# Patient Record
Sex: Female | Born: 1950 | Race: White | Hispanic: No | Marital: Married | Smoking: Never smoker
Health system: Southern US, Community
[De-identification: ages and names within clinical notes are randomized; demographics above are authoritative.]

## PROBLEM LIST (undated history)

## (undated) DIAGNOSIS — Z973 Presence of spectacles and contact lenses: Secondary | ICD-10-CM

## (undated) DIAGNOSIS — J302 Other seasonal allergic rhinitis: Secondary | ICD-10-CM

## (undated) DIAGNOSIS — N200 Calculus of kidney: Secondary | ICD-10-CM

## (undated) DIAGNOSIS — R112 Nausea with vomiting, unspecified: Secondary | ICD-10-CM

## (undated) DIAGNOSIS — H9190 Unspecified hearing loss, unspecified ear: Secondary | ICD-10-CM

## (undated) DIAGNOSIS — I10 Essential (primary) hypertension: Secondary | ICD-10-CM

## (undated) DIAGNOSIS — Z9889 Other specified postprocedural states: Secondary | ICD-10-CM

## (undated) DIAGNOSIS — M549 Dorsalgia, unspecified: Secondary | ICD-10-CM

## (undated) DIAGNOSIS — M1990 Unspecified osteoarthritis, unspecified site: Secondary | ICD-10-CM

## (undated) DIAGNOSIS — E785 Hyperlipidemia, unspecified: Secondary | ICD-10-CM

## (undated) HISTORY — PX: CYSTOSCOPY/RETROGRADE/URETEROSCOPY/STONE EXTRACTION WITH BASKET: SHX5317

## (undated) HISTORY — PX: LITHOTRIPSY: SUR834

## (undated) HISTORY — PX: DILATION AND CURETTAGE OF UTERUS: SHX78

---

## 1995-12-19 HISTORY — PX: HAMMER TOE SURGERY: SHX385

## 1996-12-18 HISTORY — PX: CHOLECYSTECTOMY: SHX55

## 1997-12-18 HISTORY — PX: ABDOMINAL HYSTERECTOMY: SHX81

## 2005-01-17 ENCOUNTER — Ambulatory Visit: Payer: Self-pay | Admitting: Unknown Physician Specialty

## 2006-02-13 ENCOUNTER — Ambulatory Visit: Payer: Self-pay | Admitting: Unknown Physician Specialty

## 2007-03-12 ENCOUNTER — Ambulatory Visit: Payer: Self-pay | Admitting: Unknown Physician Specialty

## 2007-06-18 ENCOUNTER — Ambulatory Visit: Payer: Self-pay | Admitting: General Surgery

## 2008-06-26 ENCOUNTER — Ambulatory Visit: Payer: Self-pay | Admitting: Unknown Physician Specialty

## 2009-06-28 ENCOUNTER — Ambulatory Visit: Payer: Self-pay | Admitting: Internal Medicine

## 2011-02-28 ENCOUNTER — Ambulatory Visit: Payer: Self-pay | Admitting: Internal Medicine

## 2012-05-15 ENCOUNTER — Ambulatory Visit: Payer: Self-pay | Admitting: Internal Medicine

## 2012-05-18 HISTORY — PX: REDUCTION MAMMAPLASTY: SUR839

## 2013-05-13 ENCOUNTER — Encounter (HOSPITAL_BASED_OUTPATIENT_CLINIC_OR_DEPARTMENT_OTHER): Payer: Self-pay | Admitting: *Deleted

## 2013-05-13 NOTE — Progress Notes (Signed)
To come in for bmet-ekg-denies any sleep apnea or resp-cardiac problems To bring all meds and overnight bag-dr t told her she would stay overnight

## 2013-05-16 ENCOUNTER — Encounter (HOSPITAL_BASED_OUTPATIENT_CLINIC_OR_DEPARTMENT_OTHER)
Admission: RE | Admit: 2013-05-16 | Discharge: 2013-05-16 | Disposition: A | Discharge status: Home / Self Care | Payer: BC Managed Care – PPO | Source: Ambulatory Visit | Attending: Specialist | Admitting: Specialist

## 2013-05-16 LAB — BASIC METABOLIC PANEL
BUN: 12 mg/dL (ref 6–23)
Creatinine, Ser: 0.66 mg/dL (ref 0.50–1.10)
GFR calc non Af Amer: >90 mL/min (ref >90)
Glucose, Bld: 145 mg/dL — ABNORMAL HIGH (ref 70–99)
Potassium: 3.8 mEq/L (ref 3.5–5.1)

## 2013-05-19 ENCOUNTER — Encounter (HOSPITAL_BASED_OUTPATIENT_CLINIC_OR_DEPARTMENT_OTHER): Payer: Self-pay | Admitting: *Deleted

## 2013-05-19 ENCOUNTER — Encounter (HOSPITAL_BASED_OUTPATIENT_CLINIC_OR_DEPARTMENT_OTHER)
Admission: RE | Disposition: A | Discharge status: Home / Self Care | Payer: Self-pay | Source: Ambulatory Visit | Attending: Specialist

## 2013-05-19 ENCOUNTER — Encounter (HOSPITAL_BASED_OUTPATIENT_CLINIC_OR_DEPARTMENT_OTHER): Payer: Self-pay | Admitting: Anesthesiology

## 2013-05-19 ENCOUNTER — Ambulatory Visit (HOSPITAL_BASED_OUTPATIENT_CLINIC_OR_DEPARTMENT_OTHER): Payer: BC Managed Care – PPO | Admitting: Anesthesiology

## 2013-05-19 ENCOUNTER — Ambulatory Visit (HOSPITAL_BASED_OUTPATIENT_CLINIC_OR_DEPARTMENT_OTHER)
Admission: RE | Admit: 2013-05-19 | Discharge: 2013-05-20 | Disposition: A | Discharge status: Home / Self Care | Payer: BC Managed Care – PPO | Source: Ambulatory Visit | Attending: Specialist | Admitting: Specialist

## 2013-05-19 DIAGNOSIS — M25519 Pain in unspecified shoulder: Secondary | ICD-10-CM | POA: Insufficient documentation

## 2013-05-19 DIAGNOSIS — M129 Arthropathy, unspecified: Secondary | ICD-10-CM | POA: Insufficient documentation

## 2013-05-19 DIAGNOSIS — L538 Other specified erythematous conditions: Secondary | ICD-10-CM | POA: Insufficient documentation

## 2013-05-19 DIAGNOSIS — Z79899 Other long term (current) drug therapy: Secondary | ICD-10-CM | POA: Insufficient documentation

## 2013-05-19 DIAGNOSIS — Z6839 Body mass index (BMI) 39.0-39.9, adult: Secondary | ICD-10-CM | POA: Insufficient documentation

## 2013-05-19 DIAGNOSIS — L821 Other seborrheic keratosis: Secondary | ICD-10-CM | POA: Insufficient documentation

## 2013-05-19 DIAGNOSIS — N62 Hypertrophy of breast: Secondary | ICD-10-CM | POA: Insufficient documentation

## 2013-05-19 DIAGNOSIS — N6019 Diffuse cystic mastopathy of unspecified breast: Secondary | ICD-10-CM | POA: Insufficient documentation

## 2013-05-19 DIAGNOSIS — I10 Essential (primary) hypertension: Secondary | ICD-10-CM | POA: Insufficient documentation

## 2013-05-19 DIAGNOSIS — M549 Dorsalgia, unspecified: Secondary | ICD-10-CM | POA: Insufficient documentation

## 2013-05-19 DIAGNOSIS — H9190 Unspecified hearing loss, unspecified ear: Secondary | ICD-10-CM | POA: Insufficient documentation

## 2013-05-19 DIAGNOSIS — Z885 Allergy status to narcotic agent status: Secondary | ICD-10-CM | POA: Insufficient documentation

## 2013-05-19 DIAGNOSIS — E785 Hyperlipidemia, unspecified: Secondary | ICD-10-CM | POA: Insufficient documentation

## 2013-05-19 DIAGNOSIS — J309 Allergic rhinitis, unspecified: Secondary | ICD-10-CM | POA: Insufficient documentation

## 2013-05-19 HISTORY — DX: Other specified postprocedural states: Z98.890

## 2013-05-19 HISTORY — DX: Unspecified hearing loss, unspecified ear: H91.90

## 2013-05-19 HISTORY — DX: Presence of spectacles and contact lenses: Z97.3

## 2013-05-19 HISTORY — DX: Essential (primary) hypertension: I10

## 2013-05-19 HISTORY — DX: Nausea with vomiting, unspecified: R11.2

## 2013-05-19 HISTORY — DX: Hyperlipidemia, unspecified: E78.5

## 2013-05-19 HISTORY — PX: BREAST REDUCTION SURGERY: SHX8

## 2013-05-19 HISTORY — DX: Unspecified osteoarthritis, unspecified site: M19.90

## 2013-05-19 HISTORY — DX: Other seasonal allergic rhinitis: J30.2

## 2013-05-19 HISTORY — DX: Dorsalgia, unspecified: M54.9

## 2013-05-19 LAB — POCT HEMOGLOBIN-HEMACUE: Hemoglobin: 14.6 g/dL (ref 12.0–15.0)

## 2013-05-19 SURGERY — MAMMOPLASTY, REDUCTION
Anesthesia: General | Site: Breast | Laterality: Bilateral | Wound class: Clean

## 2013-05-19 MED ORDER — HYDROMORPHONE HCL PF 1 MG/ML IJ SOLN: 0.2500 mg | INTRAMUSCULAR | Status: DC | PRN

## 2013-05-19 MED ORDER — CEFAZOLIN SODIUM-DEXTROSE 2-3 GM-% IV SOLR
2.0000 g | INTRAVENOUS | Status: AC
  Administered 2013-05-19: 2 g via INTRAVENOUS

## 2013-05-19 MED ORDER — PROPOFOL 10 MG/ML IV BOLUS
INTRAVENOUS | Status: DC | PRN
  Administered 2013-05-19: 200 mg via INTRAVENOUS

## 2013-05-19 MED ORDER — MORPHINE SULFATE 2 MG/ML IJ SOLN: 2.0000 mg | INTRAMUSCULAR | Status: DC | PRN

## 2013-05-19 MED ORDER — OXYCODONE HCL 5 MG PO TABS: 5.0000 mg | ORAL_TABLET | Freq: Once | ORAL | Status: AC | PRN

## 2013-05-19 MED ORDER — DEXTROSE IN LACTATED RINGERS 5 % IV SOLN
INTRAVENOUS | Status: DC
  Administered 2013-05-19 (×2): via INTRAVENOUS

## 2013-05-19 MED ORDER — MIDAZOLAM HCL 5 MG/5ML IJ SOLN
INTRAMUSCULAR | Status: DC | PRN
  Administered 2013-05-19: 2 mg via INTRAVENOUS

## 2013-05-19 MED ORDER — LIDOCAINE HCL (CARDIAC) 20 MG/ML IV SOLN
INTRAVENOUS | Status: DC | PRN
  Administered 2013-05-19: 80 mg via INTRAVENOUS

## 2013-05-19 MED ORDER — METOCLOPRAMIDE HCL 5 MG/ML IJ SOLN
INTRAMUSCULAR | Status: DC | PRN
  Administered 2013-05-19: 10 mg via INTRAVENOUS

## 2013-05-19 MED ORDER — ONDANSETRON HCL 4 MG PO TABS: 4.0000 mg | ORAL_TABLET | Freq: Four times a day (QID) | ORAL | Status: DC | PRN

## 2013-05-19 MED ORDER — MORPHINE SULFATE 10 MG/ML IJ SOLN
INTRAMUSCULAR | Status: DC | PRN
  Administered 2013-05-19 (×5): 2 mg via INTRAVENOUS

## 2013-05-19 MED ORDER — MIDAZOLAM HCL 2 MG/2ML IJ SOLN: 1.0000 mg | INTRAMUSCULAR | Status: DC | PRN

## 2013-05-19 MED ORDER — SODIUM CHLORIDE 0.9 % IV SOLN
INTRAVENOUS | Status: DC | PRN
  Administered 2013-05-19: 11:00:00 via INTRAMUSCULAR

## 2013-05-19 MED ORDER — PHENYLEPHRINE HCL 10 MG/ML IJ SOLN
10.0000 mg | INTRAVENOUS | Status: DC | PRN
  Administered 2013-05-19: 50 ug/min via INTRAVENOUS

## 2013-05-19 MED ORDER — PHENYLEPHRINE HCL 10 MG/ML IJ SOLN
INTRAMUSCULAR | Status: DC | PRN
  Administered 2013-05-19: 40 ug via INTRAVENOUS

## 2013-05-19 MED ORDER — CEFAZOLIN SODIUM 1-5 GM-% IV SOLN
1.0000 g | Freq: Three times a day (TID) | INTRAVENOUS | Status: DC
  Administered 2013-05-19 – 2013-05-20 (×2): 1 g via INTRAVENOUS

## 2013-05-19 MED ORDER — LACTATED RINGERS IV SOLN
INTRAVENOUS | Status: DC
  Administered 2013-05-19 (×2): via INTRAVENOUS

## 2013-05-19 MED ORDER — LABETALOL HCL 5 MG/ML IV SOLN
INTRAVENOUS | Status: DC | PRN
  Administered 2013-05-19: 5 mg via INTRAVENOUS

## 2013-05-19 MED ORDER — LORATADINE 10 MG PO TABS: 10.0000 mg | ORAL_TABLET | ORAL | Status: DC | PRN

## 2013-05-19 MED ORDER — ONDANSETRON HCL 4 MG/2ML IJ SOLN
INTRAMUSCULAR | Status: DC | PRN
  Administered 2013-05-19: 4 mg via INTRAVENOUS

## 2013-05-19 MED ORDER — SUCCINYLCHOLINE CHLORIDE 20 MG/ML IJ SOLN
INTRAMUSCULAR | Status: DC | PRN
  Administered 2013-05-19: 100 mg via INTRAVENOUS

## 2013-05-19 MED ORDER — HYDROCODONE-ACETAMINOPHEN 5-325 MG PO TABS
1.0000 | ORAL_TABLET | ORAL | Status: DC | PRN
  Administered 2013-05-19 – 2013-05-20 (×2): 2 via ORAL

## 2013-05-19 MED ORDER — FENTANYL CITRATE 0.05 MG/ML IJ SOLN
INTRAMUSCULAR | Status: DC | PRN
  Administered 2013-05-19: 100 ug via INTRAVENOUS

## 2013-05-19 MED ORDER — FENTANYL CITRATE 0.05 MG/ML IJ SOLN: 50.0000 ug | INTRAMUSCULAR | Status: DC | PRN

## 2013-05-19 MED ORDER — OXYCODONE HCL 5 MG/5ML PO SOLN: 5.0000 mg | Freq: Once | ORAL | Status: AC | PRN

## 2013-05-19 MED ORDER — DEXAMETHASONE SODIUM PHOSPHATE 4 MG/ML IJ SOLN
INTRAMUSCULAR | Status: DC | PRN
  Administered 2013-05-19: 10 mg via INTRAVENOUS

## 2013-05-19 MED ORDER — SCOPOLAMINE 1 MG/3DAYS TD PT72
1.0000 | MEDICATED_PATCH | Freq: Once | TRANSDERMAL | Status: DC
  Administered 2013-05-19: 1.5 mg via TRANSDERMAL

## 2013-05-19 MED ORDER — ONDANSETRON HCL 4 MG/2ML IJ SOLN
4.0000 mg | Freq: Four times a day (QID) | INTRAMUSCULAR | Status: DC | PRN
  Administered 2013-05-19: 4 mg via INTRAVENOUS

## 2013-05-19 MED ORDER — SCOPOLAMINE 1 MG/3DAYS TD PT72
MEDICATED_PATCH | TRANSDERMAL | Status: DC | PRN
  Administered 2013-05-19: 1 via TRANSDERMAL

## 2013-05-19 MED ORDER — METOCLOPRAMIDE HCL 5 MG/ML IJ SOLN: 10.0000 mg | Freq: Once | INTRAMUSCULAR | Status: AC | PRN

## 2013-05-19 SURGICAL SUPPLY — 88 items
BAG DECANTER FOR FLEXI CONT (MISCELLANEOUS) ×2 IMPLANT
BANDAGE GAUZE ELAST BULKY 4 IN (GAUZE/BANDAGES/DRESSINGS) IMPLANT
BENZOIN TINCTURE PRP APPL 2/3 (GAUZE/BANDAGES/DRESSINGS) ×4 IMPLANT
BINDER BREAST 3XL (BIND) ×2 IMPLANT
BINDER BREAST XLRG (GAUZE/BANDAGES/DRESSINGS) IMPLANT
BINDER BREAST XXLRG (GAUZE/BANDAGES/DRESSINGS) ×4 IMPLANT
BLADE HEX COATED 2.75 (ELECTRODE) IMPLANT
BLADE KNIFE  20 PERSONNA (BLADE) ×2
BLADE KNIFE 20 PERSONNA (BLADE) ×2 IMPLANT
BLADE KNIFE PERSONA 10 (BLADE) ×10 IMPLANT
BLADE KNIFE PERSONA 15 (BLADE) ×2 IMPLANT
BNDG COHESIVE 4X5 TAN STRL (GAUZE/BANDAGES/DRESSINGS) IMPLANT
CANISTER LINER 1300 C W/ELBOW (MISCELLANEOUS) ×2 IMPLANT
CANISTER SUCTION 1200CC (MISCELLANEOUS) ×2 IMPLANT
CLEANER CAUTERY TIP 5X5 PAD (MISCELLANEOUS) IMPLANT
CLOTH BEACON ORANGE TIMEOUT ST (SAFETY) ×2 IMPLANT
COVER MAYO STAND STRL (DRAPES) ×2 IMPLANT
COVER TABLE BACK 60X90 (DRAPES) ×2 IMPLANT
DECANTER SPIKE VIAL GLASS SM (MISCELLANEOUS) ×4 IMPLANT
DRAIN CHANNEL 10F 3/8 F FF (DRAIN) ×4 IMPLANT
DRAPE LAPAROSCOPIC ABDOMINAL (DRAPES) ×2 IMPLANT
DRAPE PED LAPAROTOMY (DRAPES) IMPLANT
DRAPE U-SHAPE 76X120 STRL (DRAPES) IMPLANT
DRAPE UTILITY XL STRL (DRAPES) ×2 IMPLANT
DRSG PAD ABDOMINAL 8X10 ST (GAUZE/BANDAGES/DRESSINGS) ×8 IMPLANT
ELECT NEEDLE TIP 2.8 STRL (NEEDLE) IMPLANT
ELECT REM PT RETURN 9FT ADLT (ELECTROSURGICAL) ×2
ELECTRODE REM PT RTRN 9FT ADLT (ELECTROSURGICAL) ×1 IMPLANT
EVACUATOR SILICONE 100CC (DRAIN) ×4 IMPLANT
FILTER 7/8 IN (FILTER) ×2 IMPLANT
FILTER LIPOSUCTION (MISCELLANEOUS) ×2 IMPLANT
GAUZE SPONGE 4X4 12PLY STRL LF (GAUZE/BANDAGES/DRESSINGS) IMPLANT
GAUZE XEROFORM 1X8 LF (GAUZE/BANDAGES/DRESSINGS) IMPLANT
GAUZE XEROFORM 5X9 LF (GAUZE/BANDAGES/DRESSINGS) ×4 IMPLANT
GLOVE BIO SURGEON STRL SZ 6.5 (GLOVE) ×4 IMPLANT
GLOVE BIOGEL M STRL SZ7.5 (GLOVE) ×4 IMPLANT
GLOVE BIOGEL PI IND STRL 8 (GLOVE) ×2 IMPLANT
GLOVE BIOGEL PI INDICATOR 8 (GLOVE) ×2
GLOVE ECLIPSE 7.0 STRL STRAW (GLOVE) ×2 IMPLANT
GOWN PREVENTION PLUS XLARGE (GOWN DISPOSABLE) IMPLANT
GOWN PREVENTION PLUS XXLARGE (GOWN DISPOSABLE) ×4 IMPLANT
IV LACTATED RINGERS 1000ML (IV SOLUTION) IMPLANT
IV NS 500ML (IV SOLUTION) ×1
IV NS 500ML BAXH (IV SOLUTION) ×1 IMPLANT
NDL SAFETY ECLIPSE 18X1.5 (NEEDLE) IMPLANT
NEEDLE HYPO 18GX1.5 SHARP (NEEDLE)
NEEDLE HYPO 25X1 1.5 SAFETY (NEEDLE) IMPLANT
NEEDLE SPNL 18GX3.5 QUINCKE PK (NEEDLE) ×2 IMPLANT
NS IRRIG 1000ML POUR BTL (IV SOLUTION) ×4 IMPLANT
PACK BASIN DAY SURGERY FS (CUSTOM PROCEDURE TRAY) ×2 IMPLANT
PAD CLEANER CAUTERY TIP 5X5 (MISCELLANEOUS)
PEN SKIN MARKING BROAD TIP (MISCELLANEOUS) ×2 IMPLANT
PENCIL BUTTON HOLSTER BLD 10FT (ELECTRODE) IMPLANT
PILLOW FOAM RUBBER ADULT (PILLOWS) ×2 IMPLANT
PIN SAFETY STERILE (MISCELLANEOUS) ×2 IMPLANT
SHEET MEDIUM DRAPE 40X70 STRL (DRAPES) IMPLANT
SHEETING SILICONE GEL EPI DERM (MISCELLANEOUS) IMPLANT
SLEEVE SCD COMPRESS KNEE MED (MISCELLANEOUS) ×2 IMPLANT
SPECIMEN JAR MEDIUM (MISCELLANEOUS) IMPLANT
SPECIMEN JAR X LARGE (MISCELLANEOUS) ×4 IMPLANT
SPONGE GAUZE 4X4 12PLY (GAUZE/BANDAGES/DRESSINGS) ×4 IMPLANT
SPONGE LAP 18X18 X RAY DECT (DISPOSABLE) ×8 IMPLANT
STOCKINETTE 4X48 STRL (DRAPES) IMPLANT
STOCKINETTE IMPERVIOUS LG (DRAPES) IMPLANT
STRIP CLOSURE SKIN 1/8X3 (GAUZE/BANDAGES/DRESSINGS) IMPLANT
STRIP SUTURE WOUND CLOSURE 1/2 (SUTURE) ×10 IMPLANT
SUT ETHILON 3 0 PS 1 (SUTURE) IMPLANT
SUT MNCRL AB 3-0 PS2 18 (SUTURE) ×16 IMPLANT
SUT MON AB 2-0 CT1 36 (SUTURE) IMPLANT
SUT MON AB 5-0 PS2 18 (SUTURE) ×6 IMPLANT
SUT PROLENE 3 0 PS 2 (SUTURE) ×12 IMPLANT
SUT PROLENE 4 0 PS 2 18 (SUTURE) IMPLANT
SUT VIC AB 0 CT1 27 (SUTURE) ×1
SUT VIC AB 0 CT1 27XBRD ANBCTR (SUTURE) ×1 IMPLANT
SYR 20CC LL (SYRINGE) IMPLANT
SYR 50ML LL SCALE MARK (SYRINGE) ×4 IMPLANT
SYR CONTROL 10ML LL (SYRINGE) IMPLANT
TAPE HYPAFIX 6X30 (GAUZE/BANDAGES/DRESSINGS) ×2 IMPLANT
TAPE MEASURE 72IN RETRACT (INSTRUMENTS)
TAPE MEASURE LINEN 72IN RETRCT (INSTRUMENTS) IMPLANT
TAPE PAPER MEDFIX 1IN X 10YD (GAUZE/BANDAGES/DRESSINGS) ×2 IMPLANT
TOWEL OR 17X24 6PK STRL BLUE (TOWEL DISPOSABLE) ×6 IMPLANT
TOWEL OR NON WOVEN STRL DISP B (DISPOSABLE) ×2 IMPLANT
TUBE CONNECTING 20X1/4 (TUBING) ×2 IMPLANT
TUBING SET GRADUATE ASPIR 12FT (MISCELLANEOUS) ×2 IMPLANT
UNDERPAD 30X30 INCONTINENT (UNDERPADS AND DIAPERS) ×6 IMPLANT
VAC PENCILS W/TUBING CLEAR (MISCELLANEOUS) ×2 IMPLANT
YANKAUER SUCT BULB TIP NO VENT (SUCTIONS) ×2 IMPLANT

## 2013-05-19 NOTE — Anesthesia Procedure Notes (Signed)
Procedure Name: Intubation Performed by: York Grice Pre-anesthesia Checklist: Patient identified, Timeout performed, Emergency Drugs available, Suction available and Patient being monitored Patient Re-evaluated:Patient Re-evaluated prior to inductionOxygen Delivery Method: Circle system utilized Preoxygenation: Pre-oxygenation with 100% oxygen Intubation Type: IV induction Ventilation: Mask ventilation without difficulty Laryngoscope Size: Miller and 2 Grade View: Grade III Tube type: Oral Tube size: 7.0 mm Number of attempts: 1 Airway Equipment and Method: Stylet Placement Confirmation: breath sounds checked- equal and bilateral and positive ETCO2 Secured at: 21 cm Tube secured with: Tape Dental Injury: Teeth and Oropharynx as per pre-operative assessment

## 2013-05-19 NOTE — Transfer of Care (Signed)
Immediate Anesthesia Transfer of Care Note  Patient: Angel Harrison  Procedure(s) Performed: Procedure(s): BILATERAL BREAST REDUCTION   (Bilateral)  Patient Location: PACU  Anesthesia Type:General  Level of Consciousness: awake and sedated  Airway & Oxygen Therapy: Patient Spontanous Breathing and Patient connected to face mask oxygen  Post-op Assessment: Report given to PACU RN and Post -op Vital signs reviewed and stable  Post vital signs: Reviewed and stable  Complications: No apparent anesthesia complications

## 2013-05-19 NOTE — Brief Op Note (Signed)
05/19/2013  12:06 PM  PATIENT:  Randie Heinz  62 y.o. female  PRE-OPERATIVE DIAGNOSIS:  MACROMASTIA BILATERAL   POST-OPERATIVE DIAGNOSIS:  MACROMASTIA BILATERAL Severe  PROCEDURE:  Procedure(s): BILATERAL BREAST REDUCTION   (Bilateral)  SURGEON:  Surgeon(s) and Role:    * Louisa Second, MD - Primary  PHYSICIAN ASSISTANT:   ASSISTANTS: none   ANESTHESIA:   general  EBL:  Total I/O In: 2300 [I.V.:2300] Out: -   BLOOD ADMINISTERED:none  DRAINS: (right and left JP DRAINS) Jackson-Pratt drain(s) with closed bulb suction in the RIGHT AND LEFT CHEST AREAS   LOCAL MEDICATIONS USED:  LIDOCAINE   SPECIMEN:  Excision  DISPOSITION OF SPECIMEN:  PATHOLOGY  COUNTS:  YES  TOURNIQUET:  * No tourniquets in log *  DICTATION: .Other Dictation: Dictation Number [id number]  PLAN OF CARE: Admit for overnight observation  PATIENT DISPOSITION:  PACU - hemodynamically stable.   Delay start of Pharmacological VTE agent (>24hrs) due to surgical blood loss or risk of bleeding: yes

## 2013-05-19 NOTE — Anesthesia Postprocedure Evaluation (Signed)
Anesthesia Post Note  Patient: Angel Harrison  Procedure(s) Performed: Procedure(s) (LRB): BILATERAL BREAST REDUCTION   (Bilateral)  Anesthesia type: General  Patient location: PACU  Post pain: Pain level controlled  Post assessment: Patient's Cardiovascular Status Stable  Last Vitals:  Filed Vitals:   05/19/13 1445  BP: 145/72  Pulse: 88  Temp:   Resp: 17    Post vital signs: Reviewed and stable  Level of consciousness: alert  Complications: No apparent anesthesia complications

## 2013-05-19 NOTE — H&P (Signed)
Angel Harrison is an 62 y.o. female.   Chief Complaint: Severe macromastia HPI: Back and shoulder pain with intertrigo  Past Medical History  Diagnosis Date  . Wears glasses   . Hypertension   . Seasonal allergies   . Hyperlipidemia   . HOH (hard of hearing)   . Back pain   . PONV (postoperative nausea and vomiting)   . Arthritis     Past Surgical History  Procedure Laterality Date  . Abdominal hysterectomy  1999  . Dilation and curettage of uterus    . Cesarean section    . Hammer toe surgery  1997    right foot  . Cholecystectomy  1998    lap choli  . Lithotripsy    . Cystoscopy/retrograde/ureteroscopy/stone extraction with basket      History reviewed. No pertinent family history. Social History:  reports that she has never smoked. She does not have any smokeless tobacco history on file. She reports that she does not drink alcohol or use illicit drugs.  Allergies:  Allergies  Allergen Reactions  . Darvon (Propoxyphene Hcl)     Dizzy-crazy    Medications Prior to Admission  Medication Sig Dispense Refill  . hydrochlorothiazide (HYDRODIURIL) 25 MG tablet Take 25 mg by mouth daily.      Marland Kitchen lisinopril (PRINIVIL,ZESTRIL) 40 MG tablet Take 40 mg by mouth daily.      Marland Kitchen loratadine (CLARITIN) 10 MG tablet Take 10 mg by mouth as needed for allergies.      . simvastatin (ZOCOR) 40 MG tablet Take 40 mg by mouth at bedtime.      Marland Kitchen acetaminophen (TYLENOL) 325 MG tablet Take 650 mg by mouth every 6 (six) hours as needed for pain.        No results found for this or any previous visit (from the past 48 hour(s)). No results found.  Review of Systems  Constitutional: Negative.   HENT: Negative.   Eyes: Negative.   Respiratory: Negative.   Cardiovascular: Negative.   Gastrointestinal: Negative.   Genitourinary: Negative.   Musculoskeletal: Positive for back pain.  Skin: Positive for rash.  Neurological: Negative.   Endo/Heme/Allergies: Negative.    Psychiatric/Behavioral: Negative.     Blood pressure 185/84, pulse 69, temperature 97.8 F (36.6 C), temperature source Oral, resp. rate 20, height 4\' 11"  (1.499 m), weight 88.451 kg (195 lb), SpO2 100.00%. Physical Exam   Assessment/Plan Bilateral breast reductions for severe macromastia  Meziah Blasingame L 05/19/2013, 9:00 AM

## 2013-05-19 NOTE — Anesthesia Preprocedure Evaluation (Signed)
Anesthesia Evaluation  Patient identified by MRN, date of birth, ID band Patient awake    Reviewed: Allergy & Precautions, H&P , NPO status , Patient's Chart, lab work & pertinent test results, reviewed documented beta blocker date and time   History of Anesthesia Complications (+) PONV  Airway Mallampati: II TM Distance: >3 FB Neck ROM: full    Dental   Pulmonary neg pulmonary ROS,  breath sounds clear to auscultation        Cardiovascular hypertension, negative cardio ROS  Rhythm:regular     Neuro/Psych negative neurological ROS  negative psych ROS   GI/Hepatic negative GI ROS, Neg liver ROS,   Endo/Other  negative endocrine ROSMorbid obesity  Renal/GU negative Renal ROS  negative genitourinary   Musculoskeletal   Abdominal   Peds  Hematology negative hematology ROS (+)   Anesthesia Other Findings See surgeon's H&P   Reproductive/Obstetrics negative OB ROS                           Anesthesia Physical Anesthesia Plan  ASA: III  Anesthesia Plan: General   Post-op Pain Management:    Induction: Intravenous  Airway Management Planned: Oral ETT  Additional Equipment:   Intra-op Plan:   Post-operative Plan: Extubation in OR  Informed Consent: I have reviewed the patients History and Physical, chart, labs and discussed the procedure including the risks, benefits and alternatives for the proposed anesthesia with the patient or authorized representative who has indicated his/her understanding and acceptance.   Dental Advisory Given  Plan Discussed with: CRNA and Surgeon  Anesthesia Plan Comments:         Anesthesia Quick Evaluation

## 2013-05-20 ENCOUNTER — Encounter (HOSPITAL_BASED_OUTPATIENT_CLINIC_OR_DEPARTMENT_OTHER): Payer: Self-pay | Admitting: Specialist

## 2013-05-20 NOTE — Op Note (Signed)
NAMEEMILYANNE, MCGOUGH NO.:  [id number]  MEDICAL RECORD NO.:  [id number]  LOCATION:                                 FACILITY:  PHYSICIAN:  Earvin Hansen L. Marisa Hufstetler, M.D.DATE OF BIRTH:  08-Oct-1951  DATE OF PROCEDURE:  05/19/2013 DATE OF DISCHARGE:  05/19/2013                              OPERATIVE REPORT   A 62 year old lady with severe macromastia, back and shoulder pain secondary to large pendulous breasts, increased indentations, history of intertriginous changes, resistant to conservative treatment.  PROCEDURES DONE:  Bilateral breast reductions using the inferior pedicle technique.  SURGEON:  Yaakov Guthrie. Shon Hough, M.D.  ANESTHESIA:  General.  Preoperatively, the patient sat up and drawn for the inferior pedicle reduction mammoplasty, over 42 cm from the suprasternal notch to 26. She underwent general anesthesia and intubated orally without difficulty.  Prep was done to the chest and breast areas in routine fashion using Hibiclens soap and solution, walled off with sterile towels and drapes so as to make a sterile field.  A 0.25% Xylocaine 1:250,000 was injected locally for vasoconstriction, a total of 200 mL per side.  After waiting an appropriate amount of time, the wounds were scored with #15 blade and the skin of the inferior pedicle was de- epithelialized #20 blade.  Medial and lateral fatty dermal pedicles were incised down to underlying pectoralis major fascia, laterally increased and accessory breast tissue was removed sharply using a Bovie coagulation.  After this, the new keyhole area was defatted and the flap was also defatted and thinned appropriately.  Again hemostasis was maintained with Bovie coagulation.  After this the flaps were transposed and stayed with 3-0 Prolene.  Subcutaneous closure was done with 3-0 Monocryl x2 layers and a running subcuticular stitch of 3-0 Monocryl, 5- 0 Monocryl throughout the inverted T.  The wounds were drained  with #10, fully fluted Blake drains which were placed in the depths of wound and brought out through the lateral-most portion of the incisions bilaterally, removed over 2000 g on each side.  After proper hemostasis, the nipple-areolar complex were examined with good blood supply and turgor.  Sterile dressing were applied including 4x4s, ABDs, Hypafix tape, and breast garment.  ESTIMATED BLOOD LOSS:  Less than 200 mL.  COMPLICATIONS:  None.     Yaakov Guthrie. Shon Hough, M.D.    Cathie Hoops  D:  05/19/2013  T:  05/20/2013  Job:  [id number]

## 2017-11-14 ENCOUNTER — Other Ambulatory Visit: Payer: Self-pay

## 2017-11-14 ENCOUNTER — Encounter: Payer: Self-pay | Admitting: Emergency Medicine

## 2017-11-14 DIAGNOSIS — N179 Acute kidney failure, unspecified: Secondary | ICD-10-CM | POA: Diagnosis not present

## 2017-11-14 DIAGNOSIS — E785 Hyperlipidemia, unspecified: Secondary | ICD-10-CM | POA: Diagnosis present

## 2017-11-14 DIAGNOSIS — I10 Essential (primary) hypertension: Secondary | ICD-10-CM | POA: Diagnosis not present

## 2017-11-14 DIAGNOSIS — N132 Hydronephrosis with renal and ureteral calculous obstruction: Secondary | ICD-10-CM | POA: Diagnosis not present

## 2017-11-14 DIAGNOSIS — R1032 Left lower quadrant pain: Secondary | ICD-10-CM | POA: Diagnosis present

## 2017-11-14 DIAGNOSIS — Z9049 Acquired absence of other specified parts of digestive tract: Secondary | ICD-10-CM

## 2017-11-14 DIAGNOSIS — Z90710 Acquired absence of both cervix and uterus: Secondary | ICD-10-CM

## 2017-11-14 LAB — COMPREHENSIVE METABOLIC PANEL
ALK PHOS: 64 U/L (ref 38–126)
ALT: 17 U/L (ref 14–54)
AST: 24 U/L (ref 15–41)
Albumin: 3.9 g/dL (ref 3.5–5.0)
Anion gap: 13 (ref 5–15)
BILIRUBIN TOTAL: 0.8 mg/dL (ref 0.3–1.2)
BUN: 20 mg/dL (ref 6–20)
CALCIUM: 9.3 mg/dL (ref 8.9–10.3)
CO2: 26 mmol/L (ref 22–32)
CREATININE: 1.05 mg/dL — AB (ref 0.44–1.00)
Chloride: 99 mmol/L — ABNORMAL LOW (ref 101–111)
GFR calc non Af Amer: 54 mL/min — ABNORMAL LOW (ref >60)
Glucose, Bld: 142 mg/dL — ABNORMAL HIGH (ref 65–99)
Potassium: 3.7 mmol/L (ref 3.5–5.1)
SODIUM: 138 mmol/L (ref 135–145)
Total Protein: 7.7 g/dL (ref 6.5–8.1)

## 2017-11-14 LAB — URINALYSIS, COMPLETE (UACMP) WITH MICROSCOPIC
BACTERIA UA: NONE SEEN
Bilirubin Urine: NEGATIVE
Glucose, UA: NEGATIVE mg/dL
KETONES UR: NEGATIVE mg/dL
NITRITE: NEGATIVE
PROTEIN: 30 mg/dL — AB
Specific Gravity, Urine: 1.025 (ref 1.005–1.030)
pH: 5 (ref 5.0–8.0)

## 2017-11-14 LAB — CBC
HCT: 40.7 % (ref 35.0–47.0)
Hemoglobin: 13.8 g/dL (ref 12.0–16.0)
MCH: 28.4 pg (ref 26.0–34.0)
MCHC: 34 g/dL (ref 32.0–36.0)
MCV: 83.7 fL (ref 80.0–100.0)
Platelets: 201 10*3/uL (ref 150–440)
RBC: 4.87 MIL/uL (ref 3.80–5.20)
RDW: 13.6 % (ref 11.5–14.5)
WBC: 8.5 10*3/uL (ref 3.6–11.0)

## 2017-11-14 LAB — LIPASE, BLOOD: Lipase: 37 U/L (ref 11–51)

## 2017-11-14 NOTE — ED Triage Notes (Signed)
Patient ambulatory to triage with steady gait, without difficulty or distress noted; pt reports left lower abd pain, nonradiating, since 730pm with no accomp symptoms

## 2017-11-15 ENCOUNTER — Inpatient Hospital Stay: Payer: Medicare Other

## 2017-11-15 ENCOUNTER — Other Ambulatory Visit: Payer: Self-pay | Admitting: Radiology

## 2017-11-15 ENCOUNTER — Encounter
Admission: EM | Disposition: A | Discharge status: Home / Self Care | Payer: Self-pay | Source: Home / Self Care | Attending: Internal Medicine

## 2017-11-15 ENCOUNTER — Inpatient Hospital Stay
Admission: EM | Admit: 2017-11-15 | Discharge: 2017-11-16 | DRG: 692 | Disposition: A | Discharge status: Home / Self Care | Payer: Medicare Other | Attending: Internal Medicine | Admitting: Internal Medicine

## 2017-11-15 ENCOUNTER — Other Ambulatory Visit: Payer: Self-pay

## 2017-11-15 ENCOUNTER — Emergency Department: Payer: Medicare Other

## 2017-11-15 DIAGNOSIS — N139 Obstructive and reflux uropathy, unspecified: Secondary | ICD-10-CM | POA: Diagnosis not present

## 2017-11-15 DIAGNOSIS — R1032 Left lower quadrant pain: Secondary | ICD-10-CM | POA: Diagnosis present

## 2017-11-15 DIAGNOSIS — Z90710 Acquired absence of both cervix and uterus: Secondary | ICD-10-CM | POA: Diagnosis not present

## 2017-11-15 DIAGNOSIS — Z9049 Acquired absence of other specified parts of digestive tract: Secondary | ICD-10-CM | POA: Diagnosis not present

## 2017-11-15 DIAGNOSIS — I10 Essential (primary) hypertension: Secondary | ICD-10-CM | POA: Diagnosis not present

## 2017-11-15 DIAGNOSIS — N201 Calculus of ureter: Secondary | ICD-10-CM

## 2017-11-15 DIAGNOSIS — E785 Hyperlipidemia, unspecified: Secondary | ICD-10-CM | POA: Diagnosis not present

## 2017-11-15 DIAGNOSIS — N179 Acute kidney failure, unspecified: Secondary | ICD-10-CM | POA: Diagnosis not present

## 2017-11-15 DIAGNOSIS — N132 Hydronephrosis with renal and ureteral calculous obstruction: Secondary | ICD-10-CM | POA: Diagnosis not present

## 2017-11-15 HISTORY — PX: EXTRACORPOREAL SHOCK WAVE LITHOTRIPSY: SHX1557

## 2017-11-15 HISTORY — DX: Calculus of kidney: N20.0

## 2017-11-15 LAB — TSH: TSH: 5.172 u[IU]/mL — ABNORMAL HIGH (ref 0.350–4.500)

## 2017-11-15 SURGERY — LITHOTRIPSY, ESWL
Anesthesia: Moderate Sedation | Laterality: Left

## 2017-11-15 MED ORDER — MORPHINE SULFATE (PF) 2 MG/ML IV SOLN
2.0000 mg | INTRAVENOUS | Status: DC | PRN
  Administered 2017-11-15 (×2): 2 mg via INTRAVENOUS
  Filled 2017-11-15 (×2): qty 1

## 2017-11-15 MED ORDER — ACETAMINOPHEN 325 MG PO TABS: 650.0000 mg | ORAL_TABLET | Freq: Four times a day (QID) | ORAL | Status: DC | PRN

## 2017-11-15 MED ORDER — CIPROFLOXACIN HCL 500 MG PO TABS
500.0000 mg | ORAL_TABLET | Freq: Once | ORAL | Status: AC
  Administered 2017-11-15: 500 mg via ORAL

## 2017-11-15 MED ORDER — ONDANSETRON HCL 4 MG/2ML IJ SOLN
4.0000 mg | Freq: Once | INTRAMUSCULAR | Status: AC
  Administered 2017-11-15: 4 mg via INTRAVENOUS

## 2017-11-15 MED ORDER — MORPHINE SULFATE (PF) 2 MG/ML IV SOLN
INTRAVENOUS | Status: AC
  Administered 2017-11-15: 2 mg via INTRAVENOUS
  Filled 2017-11-15: qty 1

## 2017-11-15 MED ORDER — MORPHINE SULFATE (PF) 2 MG/ML IV SOLN
2.0000 mg | Freq: Once | INTRAVENOUS | Status: AC
  Administered 2017-11-15: 2 mg via INTRAVENOUS
  Filled 2017-11-15: qty 1

## 2017-11-15 MED ORDER — ONDANSETRON HCL 4 MG/2ML IJ SOLN
4.0000 mg | Freq: Once | INTRAMUSCULAR | Status: DC
  Filled 2017-11-15: qty 2

## 2017-11-15 MED ORDER — MORPHINE SULFATE (PF) 2 MG/ML IV SOLN
2.0000 mg | Freq: Once | INTRAVENOUS | Status: DC
  Filled 2017-11-15: qty 1

## 2017-11-15 MED ORDER — MORPHINE SULFATE (PF) 2 MG/ML IV SOLN
2.0000 mg | Freq: Once | INTRAVENOUS | Status: AC
  Administered 2017-11-15: 2 mg via INTRAVENOUS

## 2017-11-15 MED ORDER — CIPROFLOXACIN HCL 500 MG PO TABS
ORAL_TABLET | ORAL | Status: AC
  Filled 2017-11-15: qty 1

## 2017-11-15 MED ORDER — IOPAMIDOL (ISOVUE-300) INJECTION 61%
100.0000 mL | Freq: Once | INTRAVENOUS | Status: AC | PRN
  Administered 2017-11-15: 100 mL via INTRAVENOUS

## 2017-11-15 MED ORDER — ONDANSETRON HCL 4 MG/2ML IJ SOLN: 4.0000 mg | Freq: Four times a day (QID) | INTRAMUSCULAR | Status: DC | PRN

## 2017-11-15 MED ORDER — ACETAMINOPHEN 650 MG RE SUPP
650.0000 mg | Freq: Four times a day (QID) | RECTAL | Status: DC | PRN
Start: 2017-11-15 — End: 2017-11-15

## 2017-11-15 MED ORDER — BISOPROLOL-HYDROCHLOROTHIAZIDE 10-6.25 MG PO TABS
1.0000 | ORAL_TABLET | Freq: Every day | ORAL | Status: DC
  Administered 2017-11-16: 1 via ORAL
  Filled 2017-11-15 (×2): qty 1

## 2017-11-15 MED ORDER — LABETALOL HCL 5 MG/ML IV SOLN
5.0000 mg | INTRAVENOUS | Status: DC | PRN
  Administered 2017-11-15: 10 mg via INTRAVENOUS
  Filled 2017-11-15: qty 4

## 2017-11-15 MED ORDER — MORPHINE SULFATE (PF) 2 MG/ML IV SOLN: 2.0000 mg | INTRAVENOUS | Status: DC | PRN

## 2017-11-15 MED ORDER — TAMSULOSIN HCL 0.4 MG PO CAPS
0.4000 mg | ORAL_CAPSULE | Freq: Every day | ORAL | Status: DC
  Administered 2017-11-15 – 2017-11-16 (×2): 0.4 mg via ORAL
  Filled 2017-11-15 (×2): qty 1

## 2017-11-15 MED ORDER — DOCUSATE SODIUM 100 MG PO CAPS
100.0000 mg | ORAL_CAPSULE | Freq: Two times a day (BID) | ORAL | Status: DC
  Administered 2017-11-15: 100 mg via ORAL
  Filled 2017-11-15 (×2): qty 1

## 2017-11-15 MED ORDER — MORPHINE SULFATE (PF) 2 MG/ML IV SOLN
2.0000 mg | Freq: Once | INTRAVENOUS | Status: AC
Start: 2017-11-15 — End: 2017-11-15
  Administered 2017-11-15: 2 mg via INTRAVENOUS

## 2017-11-15 MED ORDER — OXYCODONE-ACETAMINOPHEN 5-325 MG PO TABS: 1.0000 | ORAL_TABLET | ORAL | Status: DC | PRN

## 2017-11-15 MED ORDER — MORPHINE SULFATE (PF) 2 MG/ML IV SOLN
INTRAVENOUS | Status: AC
  Filled 2017-11-15: qty 1

## 2017-11-15 MED ORDER — ONDANSETRON HCL 4 MG PO TABS: 4.0000 mg | ORAL_TABLET | Freq: Four times a day (QID) | ORAL | Status: DC | PRN

## 2017-11-15 MED ORDER — SODIUM CHLORIDE 0.9 % IV SOLN
INTRAVENOUS | Status: DC
  Administered 2017-11-15 (×2): via INTRAVENOUS

## 2017-11-15 MED ORDER — HEPARIN SODIUM (PORCINE) 5000 UNIT/ML IJ SOLN: 5000.0000 [IU] | Freq: Three times a day (TID) | INTRAMUSCULAR | Status: DC

## 2017-11-15 NOTE — Discharge Instructions (Signed)
AMBULATORY SURGERY  DISCHARGE INSTRUCTIONS   1) The drugs that you were given will stay in your system until tomorrow so for the next 24 hours you should not:  A) Drive an automobile B) Make any legal decisions C) Drink any alcoholic beverage   2) You may resume regular meals tomorrow.  Today it is better to start with liquids and gradually work up to solid foods.  You may eat anything you prefer, but it is better to start with liquids, then soup and crackers, and gradually work up to solid foods.   3) Please notify your doctor immediately if you have any unusual bleeding, trouble breathing, redness and pain at the surgery site, drainage, fever, or pain not relieved by medication.    Additional Instructions: Follow written discharge instructions  provided to you [facility])       Please contact your physician with any problems or Same Day Surgery at [phone], Monday through Friday 6 am to 4 pm, or [place] at [facility] Main number at [phone].

## 2017-11-15 NOTE — ED Notes (Signed)
Pt transported to room 216. 

## 2017-11-15 NOTE — ED Provider Notes (Signed)
[facility] Emergency Department Provider Note    First MD Initiated Contact with Patient 11/15/17 0132     (approximate)  I have reviewed the triage vital signs and the nursing notes.   HISTORY  Chief Complaint Abdominal Pain    HPI Angel NearingVicki R Harrison is a 66 y.o. female with below list of chronic medical conditions presents to the emergency department acute onset of left lower quadrant abdominal pain that is currently 10 out of 10.  Patient states that the pain began at 7:00 tonight.  Patient admits to nausea however no vomiting.  Patient denies any fever.  Patient diarrhea.  Patient denies any urinary   Past Medical History:  Diagnosis Date  . Arthritis   . Back pain   . HOH (hard of hearing)   . Hyperlipidemia   . Hypertension   . Kidney stone   . PONV (postoperative nausea and vomiting)   . Seasonal allergies   . Wears glasses     Patient Active Problem List   Diagnosis Date Noted  . Obstructive uropathy 11/15/2017    Past Surgical History:  Procedure Laterality Date  . ABDOMINAL HYSTERECTOMY  1999  . BREAST REDUCTION SURGERY Bilateral 05/19/2013   Procedure: BILATERAL BREAST REDUCTION  ;  Surgeon: Louisa SecondGerald Truesdale, MD;  Location: [facility];  Service: Plastics;  Laterality: Bilateral;  . CESAREAN SECTION    . CHOLECYSTECTOMY  1998   lap choli  . CYSTOSCOPY/RETROGRADE/URETEROSCOPY/STONE EXTRACTION WITH BASKET    . DILATION AND CURETTAGE OF UTERUS    . HAMMER TOE SURGERY  1997   right foot  . LITHOTRIPSY      Prior to Admission medications   Medication Sig Start Date End Date Taking? Authorizing Provider  bisoprolol-hydrochlorothiazide (ZIAC) 10-6.25 MG tablet Take 1 tablet by mouth daily. 09/28/17  Yes [provider]  loratadine (CLARITIN) 10 MG tablet Take 10 mg by mouth as needed for allergies.   Yes [provider]  simvastatin (ZOCOR) 40 MG tablet Take 1 tablet by mouth every evening. 10/09/17   Yes [provider]    Allergies Darvon [propoxyphene hcl]  No family history on file.  Social History Social History   Tobacco Use  . Smoking status: Never Smoker  Substance Use Topics  . Alcohol use: No  . Drug use: No    Review of Systems Constitutional: No fever/chills Eyes: No visual changes. ENT: No sore throat. Cardiovascular: Denies chest pain. Respiratory: Denies shortness of breath. Gastrointestinal: Positive for abdominal pain and nausea no vomiting.  No diarrhea.  No constipation. Genitourinary: Negative for dysuria. Musculoskeletal: Negative for neck pain.  Negative for back pain. Integumentary: Negative for rash. Neurological: Negative for headaches, focal weakness or numbness.   ____________________________________________   PHYSICAL EXAM:  VITAL SIGNS: ED Triage Vitals  Enc Vitals Group     BP 11/14/17 2327 (!) 228/85     Pulse Rate 11/14/17 2327 64     Resp 11/14/17 2327 20     Temp 11/14/17 2327 98.2 F (36.8 C)     Temp Source 11/14/17 2327 Oral     SpO2 11/14/17 2327 95 %     Weight 11/14/17 2325 81.6 kg (180 lb)     Height 11/14/17 2325 1.499 m (4\' 11" )     Head Circumference --      Peak Flow --      Pain Score 11/14/17 2324 8     Pain Loc --  Pain Edu? --      Excl. in GC? --     Constitutional: Alert and oriented. Well appearing and in no acute distress. Eyes: Conjunctivae are normal.  Head: Atraumatic. Mouth/Throat: Mucous membranes are moist. Oropharynx non-erythematous. Neck: No stridor. Cardiovascular: Normal rate, regular rhythm. Good peripheral circulation. Grossly normal heart sounds. Respiratory: Normal respiratory effort.  No retractions. Lungs CTAB. Gastrointestinal: Soft and nontender. No distention.  Musculoskeletal: No lower extremity tenderness nor edema. No gross deformities of extremities. Neurologic:  Normal speech and language. No gross focal neurologic deficits are appreciated.  Skin:  Skin is  warm, dry and intact. No rash noted. Psychiatric: Mood and affect are normal. Speech and behavior are normal.  ____________________________________________   LABS (all labs ordered are listed, but only abnormal results are displayed)  Labs Reviewed  COMPREHENSIVE METABOLIC PANEL - Abnormal; Notable for the following components:      Result Value   Chloride 99 (*)    Glucose, Bld 142 (*)    Creatinine, Ser 1.05 (*)    GFR calc non Af Amer 54 (*)    All other components within normal limits  URINALYSIS, COMPLETE (UACMP) WITH MICROSCOPIC - Abnormal; Notable for the following components:   Color, Urine YELLOW (*)    APPearance HAZY (*)    Hgb urine dipstick SMALL (*)    Protein, ur 30 (*)    Leukocytes, UA MODERATE (*)    Squamous Epithelial / LPF 0-5 (*)    All other components within normal limits  LIPASE, BLOOD  CBC     RADIOLOGY I, Easley N Cassity Christian, personally viewed and evaluated these images (plain radiographs) as part of my medical decision making, as well as reviewing the written report by the radiologist.  Ct Abdomen Pelvis W Contrast  Result Date: 11/15/2017 CLINICAL DATA:  Left lower quadrant pain EXAM: CT ABDOMEN AND PELVIS WITH CONTRAST TECHNIQUE: Multidetector CT imaging of the abdomen and pelvis was performed using the standard protocol following bolus administration of intravenous contrast. CONTRAST:  ISOVUE-300 IOPAMIDOL (ISOVUE-300) INJECTION 61% COMPARISON:  Report 06/29/2004 FINDINGS: Lower chest: No acute abnormality. Calcified fat density mass within the right breast, likely representing fat necrosis related to history of breast reduction. Hepatobiliary: Status post cholecystectomy. Mild intra and extrahepatic biliary dilatation. Pancreas: Unremarkable. No pancreatic ductal dilatation or surrounding inflammatory changes. Spleen: Normal in size without focal abnormality. Adrenals/Urinary Tract: Right adrenal gland is normal. 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 Punctate nonobstructing stones in the mid right kidney. Left perinephric fat stranding with severe hydronephrosis and hydroureter. This is secondary to a 9 mm stone within the mid to distal left ureter, at the S1 level. There are 2 smaller stones about a cm distal to the dominant stone, these measure up to 3 mm in size. The bladder is unremarkable There are multiple stones within the left kidney, these measure up cysts 7 mm in the lower pole. Delayed left nephrogram due to hydronephrosis. Stomach/Bowel: Stomach is within normal limits. Appendix appears normal. No evidence of bowel wall thickening, distention, or inflammatory changes. Vascular/Lymphatic: Moderate atherosclerosis. No aneurysmal dilatation. No significant adenopathy Reproductive: Status post hysterectomy. No adnexal masses. Other: Negative for free air or free fluid. Fat containing umbilical and periumbilical hernias. Musculoskeletal: Grade 1 anterolisthesis of L5 on S1. Degenerative changes. IMPRESSION: 1. Severe left hydronephrosis and hydroureter, secondary to a 9 mm stone within the mid to distal left ureter, anterior to approximate S1 level. There  are at least 2 additional smaller stones within the left ureter, about a cm distal to the large 9 mm stone. 2. Multiple intrarenal stones bilaterally Electronically Signed   By: Jasmine PangKim  Fujinaga M.D.   On: 11/15/2017 02:48      Procedures   ____________________________________________   INITIAL IMPRESSION / ASSESSMENT AND PLAN / ED COURSE  As part of my medical decision making, I reviewed the following data within the electronic MEDICAL RECORD [id number] year old female presented with above-stated history and physical exam concerning for possible kidney stone versus diverticulitis.  As such CT scan of the abdomen was performed which revealed multiple stones in the left ureter largest of which is 9 mm.  Patient has received 3 doses of IV morphine in the  emergency department with continued discomfort and as such patient discussed with Dr. Sheryle Haildiamond for hospital admission for further evaluation and management.  Patient also discussed with Dr. Vanna ScotlandAshley Brandon urologist on call.  ____________________________________________  FINAL CLINICAL IMPRESSION(S) / ED DIAGNOSES  Final diagnoses:  Obstructive uropathy  Ureterolithiasis     MEDICATIONS GIVEN DURING THIS VISIT:  Medications  morphine 2 MG/ML injection 2 mg (not administered)  morphine 2 MG/ML injection (not administered)  morphine 2 MG/ML injection 2 mg (2 mg Intravenous Given 11/15/17 0153)  ondansetron (ZOFRAN) injection 4 mg (4 mg Intravenous Given 11/15/17 0153)  iopamidol (ISOVUE-300) 61 % injection 100 mL (100 mLs Intravenous Contrast Given 11/15/17 0216)  morphine 2 MG/ML injection (2 mg Intravenous Given 11/15/17 0321)  morphine 2 MG/ML injection 2 mg (2 mg Intravenous Given 11/15/17 0240)     ED Discharge Orders    None       Note:  This document was prepared using Dragon voice recognition software and may include unintentional dictation errors.    Darci CurrentBrown, Cial[place] N, MD 11/15/17 [phone]

## 2017-11-15 NOTE — ED Notes (Signed)
Pt to the ER for llq pain that began today. Pt has a hx of kidney stones but it has been 20 years since having one. Pt denies pain with urination or dark urine.

## 2017-11-15 NOTE — OR Nursing (Signed)
Called report to Leslye Peerlivia Rogers RN,  Transported patient to room 216 via wheelchair.

## 2017-11-15 NOTE — H&P (Signed)
Angel Harrison is an 66 y.o. female.   Chief Complaint: Abdominal pain HPI: The patient with past medical history of hypertension and remote kidney stones presents to the emergency department complaining of abdominal pain.  It began approximately 9 hours prior to admission.  The patient states that would wax and wane in severity but became too great to bear.  In the emergency department CT of her abdomen revealed a 9 mm kidney stone on the left with associated hydronephrosis.  The patient received multiple doses of IV pain medication without relief of pain.  Urology was notified and the emergency department staff contacted the hospitalist service for admission.  Past Medical History:  Diagnosis Date  . Arthritis   . Back pain   . HOH (hard of hearing)   . Hyperlipidemia   . Hypertension   . Kidney stone   . PONV (postoperative nausea and vomiting)   . Seasonal allergies   . Wears glasses     Past Surgical History:  Procedure Laterality Date  . ABDOMINAL HYSTERECTOMY  1999  . BREAST REDUCTION SURGERY Bilateral 05/19/2013   Procedure: BILATERAL BREAST REDUCTION  ;  Surgeon: Cristine Polio, MD;  Location: [facility];  Service: Plastics;  Laterality: Bilateral;  . CESAREAN SECTION    . CHOLECYSTECTOMY  1998   lap choli  . CYSTOSCOPY/RETROGRADE/URETEROSCOPY/STONE EXTRACTION WITH BASKET    . DILATION AND CURETTAGE OF UTERUS    . [place]   right foot  . LITHOTRIPSY      Family History  Problem Relation Age of Onset  . CAD Father    Social History:  reports that  has never smoked. She does not have any smokeless tobacco history on file. She reports that she does not drink alcohol or use drugs.  Allergies:  Allergies  Allergen Reactions  . Darvon [Propoxyphene Hcl]     Dizzy-crazy    Medications Prior to Admission  Medication Sig Dispense Refill  . bisoprolol-hydrochlorothiazide (ZIAC) 10-6.25 MG tablet Take 1 tablet by mouth daily.    Marland Kitchen  loratadine (CLARITIN) 10 MG tablet Take 10 mg by mouth as needed for allergies.    . simvastatin (ZOCOR) 40 MG tablet Take 1 tablet by mouth every evening.      Results for orders placed or performed during the hospital encounter of 11/15/17 (from the past 48 hour(s))  Lipase, blood     Status: None   Collection Time: 11/14/17 11:22 PM  Result Value Ref Range   Lipase 37 11 - 51 U/L  Comprehensive metabolic panel     Status: Abnormal   Collection Time: 11/14/17 11:22 PM  Result Value Ref Range   Sodium 138 135 - 145 mmol/L   Potassium 3.7 3.5 - 5.1 mmol/L   Chloride 99 (L) 101 - 111 mmol/L   CO2 26 22 - 32 mmol/L   Glucose, Bld 142 (H) 65 - 99 mg/dL   BUN 20 6 - 20 mg/dL   Creatinine, Ser 1.05 (H) 0.44 - 1.00 mg/dL   Calcium 9.3 8.9 - 10.3 mg/dL   Total Protein 7.7 6.5 - 8.1 g/dL   Albumin 3.9 3.5 - 5.0 g/dL   AST 24 15 - 41 U/L   ALT 17 14 - 54 U/L   Alkaline Phosphatase 64 38 - 126 U/L   Total Bilirubin 0.8 0.3 - 1.2 mg/dL   GFR calc non Af Amer 54 (L) >60 mL/min   GFR calc Af Amer >60 >60 mL/min  Comment: (NOTE) The eGFR has been calculated using the CKD EPI equation. This calculation has not been validated in all clinical situations. eGFR's persistently <60 mL/min signify possible Chronic Kidney Disease.    Anion gap 13 5 - 15  CBC     Status: None   Collection Time: 11/14/17 11:22 PM  Result Value Ref Range   WBC 8.5 3.6 - 11.0 K/uL   RBC 4.87 3.80 - 5.20 MIL/uL   Hemoglobin 13.8 12.0 - 16.0 g/dL   HCT 40.7 35.0 - 47.0 %   MCV 83.7 80.0 - 100.0 fL   MCH 28.4 26.0 - 34.0 pg   MCHC 34.0 32.0 - 36.0 g/dL   RDW 13.6 11.5 - 14.5 %   Platelets 201 150 - 440 K/uL  Urinalysis, Complete w Microscopic     Status: Abnormal   Collection Time: 11/14/17 11:27 PM  Result Value Ref Range   Color, Urine YELLOW (A) YELLOW   APPearance HAZY (A) CLEAR   Specific Gravity, Urine 1.025 1.005 - 1.030   pH 5.0 5.0 - 8.0   Glucose, UA NEGATIVE NEGATIVE mg/dL   Hgb urine dipstick  SMALL (A) NEGATIVE   Bilirubin Urine NEGATIVE NEGATIVE   Ketones, ur NEGATIVE NEGATIVE mg/dL   Protein, ur 30 (A) NEGATIVE mg/dL   Nitrite NEGATIVE NEGATIVE   Leukocytes, UA MODERATE (A) NEGATIVE   RBC / HPF 6-30 0 - 5 RBC/hpf   WBC, UA 6-30 0 - 5 WBC/hpf   Bacteria, UA NONE SEEN NONE SEEN   Squamous Epithelial / LPF 0-5 (A) NONE SEEN   Mucus PRESENT    Ca Oxalate Crys, UA PRESENT    Ct Abdomen Pelvis W Contrast  Result Date: 11/15/2017 CLINICAL DATA:  Left lower quadrant pain EXAM: CT ABDOMEN AND PELVIS WITH CONTRAST TECHNIQUE: Multidetector CT imaging of the abdomen and pelvis was performed using the standard protocol following bolus administration of intravenous contrast. CONTRAST:  139m ISOVUE-300 IOPAMIDOL (ISOVUE-300) INJECTION 61% COMPARISON:  Report 06/29/2004 FINDINGS: Lower chest: No acute abnormality. Calcified fat density mass within the right breast, likely representing fat necrosis related to history of breast reduction. Hepatobiliary: Status post cholecystectomy. Mild intra and extrahepatic biliary dilatation. Pancreas: Unremarkable. No pancreatic ductal dilatation or surrounding inflammatory changes. Spleen: Normal in size without focal abnormality. Adrenals/Urinary Tract: Right adrenal gland is normal. 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 Punctate nonobstructing stones in the mid right kidney. Left perinephric fat stranding with severe hydronephrosis and hydroureter. This is secondary to a 9 mm stone within the mid to distal left ureter, at the S1 level. There are 2 smaller stones about a cm distal to the dominant stone, these measure up to 3 mm in size. The bladder is unremarkable There are multiple stones within the left kidney, these measure up cysts 7 mm in the lower pole. Delayed left nephrogram due to hydronephrosis. Stomach/Bowel: Stomach is within normal limits. Appendix appears normal. No evidence of bowel wall thickening,  distention, or inflammatory changes. Vascular/Lymphatic: Moderate atherosclerosis. No aneurysmal dilatation. No significant adenopathy Reproductive: Status post hysterectomy. No adnexal masses. Other: Negative for free air or free fluid. Fat containing umbilical and periumbilical hernias. Musculoskeletal: Grade 1 anterolisthesis of L5 on S1. Degenerative changes. IMPRESSION: 1. Severe left hydronephrosis and hydroureter, secondary to a 9 mm stone within the mid to distal left ureter, anterior to approximate S1 level. There are at least 2 additional smaller stones within the left ureter, about a cm distal to  the large 9 mm stone. 2. Multiple intrarenal stones bilaterally Electronically Signed   By: Donavan Foil M.D.   On: 11/15/2017 02:48    Review of Systems  Constitutional: Negative for chills and fever.  HENT: Negative for sore throat and tinnitus.   Eyes: Negative for blurred vision and redness.  Respiratory: Negative for cough and shortness of breath.   Cardiovascular: Negative for chest pain, palpitations, orthopnea and PND.  Gastrointestinal: Positive for abdominal pain. Negative for diarrhea, nausea and vomiting.  Genitourinary: Negative for dysuria, frequency and urgency.  Musculoskeletal: Negative for joint pain and myalgias.  Skin: Negative for rash.       No lesions  Neurological: Negative for speech change, focal weakness and weakness.  Endo/Heme/Allergies: Does not bruise/bleed easily.       No temperature intolerance  Psychiatric/Behavioral: Negative for depression and suicidal ideas.    Blood pressure (!) 179/88, pulse 64, temperature 98.2 F (36.8 C), temperature source Oral, resp. rate 20, height _0  (1.499 m), weight 81.6 kg (180 lb), SpO2 97 %. Physical Exam  Vitals reviewed. Constitutional: She is oriented to person, place, and time. She appears well-developed and well-nourished. No distress.  HENT:  Head: Normocephalic and atraumatic.  Mouth/Throat: Oropharynx is  clear and moist.  Eyes: Conjunctivae and EOM are normal. Pupils are equal, round, and reactive to light. No scleral icterus.  Neck: Normal range of motion. Neck supple. No JVD present. No tracheal deviation present. No thyromegaly present.  Cardiovascular: Normal rate, regular rhythm and normal heart sounds. Exam reveals no gallop and no friction rub.  No murmur heard. Respiratory: Effort normal and breath sounds normal.  GI: Soft. Bowel sounds are normal. She exhibits no distension. There is no tenderness.  Genitourinary:  Genitourinary Comments: Deferred  Musculoskeletal: Normal range of motion. She exhibits no edema.  Lymphadenopathy:    She has no cervical adenopathy.  Neurological: She is alert and oriented to person, place, and time. No cranial nerve deficit. She exhibits normal muscle tone.  Skin: Skin is warm and dry. No rash noted. No erythema.  Psychiatric: She has a normal mood and affect. Her behavior is normal. Judgment and thought content normal.     Assessment/Plan This is a 66 year old female admitted for obstructive uropathy. 1.  Obstructive uropathy: Secondary to large renal calculus within left ureter.  Associated hydronephrosis.  Hydrate aggressively with intravenous fluid.  IV narcotics for analgesia.  The patient is n.p.o. for anticipated lithotripsy. 2.  Acute kidney injury: Secondary to hydronephrosis.  Avoid nephrotoxic agents.  Monitor urine output. 3.  Hypertension: Uncontrolled; continue bisoprolol with hydrochlorothiazide.  Labetalol as needed 4.  DVT prophylaxis: SCDs 5.  GI prophylaxis: None The patient is a full code.  Time spent on admission orders and patient care approximately 45 minutes  Harrie Foreman, MD 11/15/2017, 4:58 AM

## 2017-11-15 NOTE — Consult Note (Signed)
Urology Consult  I have been asked to see the patient by Dr. Manson PasseyBrown, for evaluation and management of left ureteral stone.  Chief Complaint: left flank pain  History of Present Illness: Angel Harrison is a 66 y.o. year old with a remote history of nephrolithiasis who presented to the emergency room early this morning with poorly controlled left lower quadrant pain/left flank pain.  The pain is severe and unable to be controlled well with IV narcotics.  Due to this, she was admitted to the medical service for pain control.  She reports that she developed severe acute left lower quadrant pain which started yesterday evening and has been unremitting since this.  She does have associated nausea but no vomiting.  No fevers or chills.  No dysuria, gross hematuria, or any other voiding symptoms.  Noncontrast CT scan shows a 9 and 3 mm  left ureteral stone near the level of the iliac vessels with severe hydroureteronephrosis proximally.   Labs and UA otherwise fairly unremarkable.  She does have a remote history of kidney stones about 20 years ago.  She had shockwave lithotripsy as well as ureteroscopy in the remote past. \  Past Medical History:  Diagnosis Date  . Arthritis   . Back pain   . HOH (hard of hearing)   . Hyperlipidemia   . Hypertension   . Kidney stone   . PONV (postoperative nausea and vomiting)   . Seasonal allergies   . Wears glasses     Past Surgical History:  Procedure Laterality Date  . ABDOMINAL HYSTERECTOMY  1999  . BREAST REDUCTION SURGERY Bilateral 05/19/2013   Procedure: BILATERAL BREAST REDUCTION  ;  Surgeon: Louisa SecondGerald Truesdale, MD;  Location: [facility];  Service: Plastics;  Laterality: Bilateral;  . CESAREAN SECTION    . CHOLECYSTECTOMY  1998   lap choli  . CYSTOSCOPY/RETROGRADE/URETEROSCOPY/STONE EXTRACTION WITH BASKET    . DILATION AND CURETTAGE OF UTERUS    . HAMMER TOE SURGERY  1997   right foot  . LITHOTRIPSY      Home  Medications:  Current Meds  Medication Sig  . bisoprolol-hydrochlorothiazide (ZIAC) 10-6.25 MG tablet Take 1 tablet by mouth daily.  Marland Kitchen. loratadine (CLARITIN) 10 MG tablet Take 10 mg by mouth as needed for allergies.  . simvastatin (ZOCOR) 40 MG tablet Take 1 tablet by mouth every evening.    Allergies:  Allergies  Allergen Reactions  . Darvon [Propoxyphene Hcl]     Dizzy-crazy    Family History  Problem Relation Age of Onset  . CAD Father     Social History:  reports that  has never smoked. she has never used smokeless tobacco. She reports that she does not drink alcohol or use drugs.  ROS: A complete review of systems was performed.  All systems are negative except for pertinent findings as noted.  Physical Exam:  Vital signs in last 24 hours: Temp:  [97.9 F (36.6 C)-98.2 F (36.8 C)] 97.9 F (36.6 C) (11/29 0506) Pulse Rate:  [62-76] 62 (11/29 0554) Resp:  [20] 20 (11/29 0506) BP: (119-228)/(66-88) 147/69 (11/29 0554) SpO2:  [94 %-97 %] 97 % (11/29 0506) Weight:  [170 lb 9.6 oz (77.4 kg)-180 lb (81.6 kg)] 170 lb 9.6 oz (77.4 kg) (11/29 0506) Constitutional:  Alert and oriented, No acute distress HEENT: [place] AT, moist mucus membranes.  Trachea midline, no masses Cardiovascular: Regular rate and rhythm, no clubbing, cyanosis, or edema. Respiratory: Normal respiratory effort, lungs clear bilaterally  GI: Abdomen is soft, tenderness n the left lower quadrant with voluntary guarding. GU: No CVA tenderness Skin: No rashes, bruises or suspicious lesions Neurologic: Grossly intact, no focal deficits, moving all 4 extremities Psychiatric: Normal mood and affect   Laboratory Data:  Recent Labs    11/14/17 2322  WBC 8.5  HGB 13.8  HCT 40.7   Recent Labs    11/14/17 2322  NA 138  K 3.7  CL 99*  CO2 26  GLUCOSE 142*  BUN 20  CREATININE 1.05*  CALCIUM 9.3   Component     Latest Ref Rng & Units 11/14/2017  Color, Urine     YELLOW YELLOW (A)  Appearance      CLEAR HAZY (A)  Specific Gravity, Urine     1.005 - 1.030 1.025  pH     5.0 - 8.0 5.0  Glucose     NEGATIVE mg/dL NEGATIVE  Hgb urine dipstick     NEGATIVE SMALL (A)  Bilirubin Urine     NEGATIVE NEGATIVE  Ketones, ur     NEGATIVE mg/dL NEGATIVE  Protein     NEGATIVE mg/dL 30 (A)  Nitrite     NEGATIVE NEGATIVE  Leukocytes, UA     NEGATIVE MODERATE (A)  RBC / HPF     0 - 5 RBC/hpf 6-30  WBC, UA     0 - 5 WBC/hpf 6-30  Bacteria, UA     NONE SEEN NONE SEEN  Squamous Epithelial / LPF     NONE SEEN 0-5 (A)  Mucus      PRESENT  Ca Oxalate Crys, UA      PRESENT    Radiologic Imaging: Ct Abdomen Pelvis W Contrast  Result Date: 11/15/2017 CLINICAL DATA:  Left lower quadrant pain EXAM: CT ABDOMEN AND PELVIS WITH CONTRAST TECHNIQUE: Multidetector CT imaging of the abdomen and pelvis was performed using the standard protocol following bolus administration of intravenous contrast. CONTRAST:  100mL ISOVUE-300 IOPAMIDOL (ISOVUE-300) INJECTION 61% COMPARISON:  Report 06/29/2004 FINDINGS: Lower chest: No acute abnormality. Calcified fat density mass within the right breast, likely representing fat necrosis related to history of breast reduction. Hepatobiliary: Status post cholecystectomy. Mild intra and extrahepatic biliary dilatation. Pancreas: Unremarkable. No pancreatic ductal dilatation or surrounding inflammatory changes. Spleen: Normal in size without focal abnormality. Adrenals/Urinary Tract: Right adrenal gland is normal. 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 Punctate nonobstructing stones in the mid right kidney. Left perinephric fat stranding with severe hydronephrosis and hydroureter. This is secondary to a 9 mm stone within the mid to distal left ureter, at the S1 level. There are 2 smaller stones about a cm distal to the dominant stone, these measure up to 3 mm in size. The bladder is unremarkable There are multiple stones within the left  kidney, these measure up cysts 7 mm in the lower pole. Delayed left nephrogram due to hydronephrosis. Stomach/Bowel: Stomach is within normal limits. Appendix appears normal. No evidence of bowel wall thickening, distention, or inflammatory changes. Vascular/Lymphatic: Moderate atherosclerosis. No aneurysmal dilatation. No significant adenopathy Reproductive: Status post hysterectomy. No adnexal masses. Other: Negative for free air or free fluid. Fat containing umbilical and periumbilical hernias. Musculoskeletal: Grade 1 anterolisthesis of L5 on S1. Degenerative changes. IMPRESSION: 1. Severe left hydronephrosis and hydroureter, secondary to a 9 mm stone within the mid to distal left ureter, anterior to approximate S1 level. There are at least 2 additional smaller stones within the left ureter, about a  cm distal to the large 9 mm stone. 2. Multiple intrarenal stones bilaterally Electronically Signed   By: Jasmine Pang M.D.   On: 11/15/2017 02:48   CT scan personally reviewed today.  Impression/ Plan:  66 year old female with a large left distal ureteral calculus of the level of the iliacs with 2 additional adjacent smaller stones in fairly close proximity (just distal to the stone) signs or symptoms of infection but poorly controlled pain.  1.left ureteral calculus-the size of the stone, it is a fairly low chance of passing on its own.  As such, I would recommend intervention which will also improve her pain.We discussed various treatment options including ESWL vs. ureteroscopy, laser lithotripsy, and stent. We discussed the risks and benefits of both including bleeding, infection, damage to surrounding structures, efficacy with need for possible further intervention, and need for temporary ureteral stent.  She is most interested in ureteral she understands that given the amount of stone burden in the left distal ureter, there is a chance of failure of this procedure.  She had good success in the past.   She also understands that if the intervention fails, she will need ureteroscopy.  As such, we will offer this to her today.  Consent form reviewed and signed.    2.  Left hydroureteronephrosis-secondary to #1.  Severity of hydroureteronephrosis suggested that stone may have been more chronic than patient's report.  11/15/2017, 7:52 AM  Vanna Scotland,  MD

## 2017-11-15 NOTE — Progress Notes (Signed)
S/p ureteroscopy. The patient has no complaints. Blood pressure is elevated. Physical examination sees unremarkable.  1.  Obstructive uropathy: Secondary to large renal calculus within left ureter.  Associated hydronephrosis.     Continue intravenous fluid.  IV narcotics for analgesia.  The patient is n.p.o. for anticipated lithotripsy. 2.  Acute kidney injury: Secondary to hydronephrosis.  Avoid nephrotoxic agents.  Monitor urine output.  Follow-up BMP. 3.  Accelerated Hypertension; continue bisoprolol with hydrochlorothiazide.  Labetalol as needed Continue current treatment.  Follow-up urologist recommendation. I discussed with the patient's husband and the son.  Time spent about 25 minutes.

## 2017-11-15 NOTE — ED Notes (Signed)
Dr Diamond in with pt. 

## 2017-11-15 NOTE — Progress Notes (Signed)
ID E note 66 yo admitted with onstructive uropathy due to nephrolithiasis. On admit no fever, wbc 8, UA 6-30 wbc.   No obvious infection.   Rec I will see tomorrow  For now monitor for UTI and can dose abx per urology around procedure.

## 2017-11-15 NOTE — OR Nursing (Signed)
Small area of erythema noted to lower left abdomen at procedure site.  Supplies given and explained for urinary collection and straining.  Copy of instructions given and explained to patient and family.

## 2017-11-16 ENCOUNTER — Encounter: Payer: Self-pay | Admitting: Urology

## 2017-11-16 DIAGNOSIS — R1032 Left lower quadrant pain: Secondary | ICD-10-CM | POA: Diagnosis not present

## 2017-11-16 DIAGNOSIS — N201 Calculus of ureter: Secondary | ICD-10-CM | POA: Diagnosis not present

## 2017-11-16 DIAGNOSIS — N132 Hydronephrosis with renal and ureteral calculous obstruction: Secondary | ICD-10-CM | POA: Diagnosis not present

## 2017-11-16 DIAGNOSIS — N139 Obstructive and reflux uropathy, unspecified: Secondary | ICD-10-CM | POA: Diagnosis not present

## 2017-11-16 LAB — BASIC METABOLIC PANEL
Anion gap: 7 (ref 5–15)
BUN: 14 mg/dL (ref 6–20)
CHLORIDE: 106 mmol/L (ref 101–111)
CO2: 25 mmol/L (ref 22–32)
CREATININE: 0.78 mg/dL (ref 0.44–1.00)
Calcium: 8.6 mg/dL — ABNORMAL LOW (ref 8.9–10.3)
GFR calc Af Amer: >60 mL/min (ref >60)
GFR calc non Af Amer: >60 mL/min (ref >60)
GLUCOSE: 122 mg/dL — AB (ref 65–99)
POTASSIUM: 3.8 mmol/L (ref 3.5–5.1)
SODIUM: 138 mmol/L (ref 135–145)

## 2017-11-16 LAB — MAGNESIUM: Magnesium: 1.8 mg/dL (ref 1.7–2.4)

## 2017-11-16 MED ORDER — OXYCODONE-ACETAMINOPHEN 5-325 MG PO TABS: 1.0000 | ORAL_TABLET | ORAL | 0 refills | Status: DC | PRN

## 2017-11-16 MED ORDER — TAMSULOSIN HCL 0.4 MG PO CAPS
0.4000 mg | ORAL_CAPSULE | Freq: Every day | ORAL | 0 refills | Status: DC
Start: 2017-11-16 — End: 2018-11-01

## 2017-11-16 NOTE — Discharge Summary (Signed)
[facility] - [place] [facility]   PATIENT NAME: Angel Harrison    MR#:  [id number]  DATE OF BIRTH:  [birth year]  DATE OF ADMISSION:  11/15/2017   ADMITTING PHYSICIAN: Arnaldo NatalMichael S Diamond, MD  DATE OF DISCHARGE: 11/16/2017 10:20 AM  PRIMARY CARE PHYSICIAN: Mick SellFitzgerald, David P, MD   ADMISSION DIAGNOSIS:  Ureterolithiasis [N20.1] Obstructive uropathy [N13.9] DISCHARGE DIAGNOSIS:  Active Problems:   Obstructive uropathy  SECONDARY DIAGNOSIS:   Past Medical History:  Diagnosis Date  . Arthritis   . Back pain   . HOH (hard of hearing)   . Hyperlipidemia   . Hypertension   . Kidney stone   . PONV (postoperative nausea and vomiting)   . Seasonal allergies   . Wears glasses    HOSPITAL COURSE:  1. Obstructive uropathy: Secondary to large renal calculus within left ureter. Associated hydronephrosis.   She was given intravenous fluid. IV narcotics for analgesia. status post shockwave lithotripsy for a left ureteral calculus.  No complaints. Continue Flomax and Percocet as needed.  2. Acute kidney injury: Secondary to hydronephrosis. Improved.   3.  AcceleratedHypertension; continue bisoprolol with hydrochlorothiazide. Labetalol as needed Discussed with Dr. Apolinar JunesBrandon. DISCHARGE CONDITIONS:  Stable, discharged to home today. CONSULTS OBTAINED:  Treatment Team:  Vanna ScotlandBrandon, Ashley, MD DRUG ALLERGIES:   Allergies  Allergen Reactions  . Darvon [Propoxyphene Hcl]     Dizzy-crazy   DISCHARGE MEDICATIONS:   Allergies as of 11/16/2017      Reactions   Darvon [propoxyphene Hcl]    Dizzy-crazy      Medication List    TAKE these medications   bisoprolol-hydrochlorothiazide 10-6.25 MG tablet Commonly known as:  ZIAC Take 1 tablet by mouth daily.   loratadine 10 MG tablet Commonly known as:  CLARITIN Take 10 mg by mouth as needed for allergies.   oxyCODONE-acetaminophen 5-325 MG tablet Commonly known as:  PERCOCET/ROXICET Take 1 tablet by  mouth every 4 (four) hours as needed for moderate pain.   simvastatin 40 MG tablet Commonly known as:  ZOCOR Take 1 tablet by mouth every evening.   tamsulosin 0.4 MG Caps capsule Commonly known as:  FLOMAX Take 1 capsule (0.4 mg total) by mouth daily.        DISCHARGE INSTRUCTIONS:  See AVS.   If you experience worsening of your admission symptoms, develop shortness of breath, life threatening emergency, suicidal or homicidal thoughts you must seek medical attention immediately by calling 911 or calling your MD immediately  if symptoms less severe.  You Must read complete instructions/literature along with all the possible adverse reactions/side effects for all the Medicines you take and that have been prescribed to you. Take any new Medicines after you have completely understood and accpet all the possible adverse reactions/side effects.   Please note  You were cared for by a hospitalist during your hospital stay. If you have any questions about your discharge medications or the care you received while you were in the hospital after you are discharged, you can call the unit and asked to speak with the hospitalist on call if the hospitalist that took care of you is not available. Once you are discharged, your primary care physician will handle any further medical issues. Please note that NO REFILLS for any discharge medications will be authorized once you are discharged, as it is imperative that you return to your primary care physician (or establish a relationship with a primary care physician if you do not have one) for your  aftercare needs so that they can reassess your need for medications and monitor your lab values.    On the day of Discharge:  VITAL SIGNS:  Blood pressure (!) 160/64, pulse 66, temperature 97.8 F (36.6 C), temperature source Oral, resp. rate 16, height 4\' 11"  (1.499 m), weight 186 lb 8 oz (84.6 kg), SpO2 97 %. PHYSICAL EXAMINATION:  GENERAL:  66 y.o.-year-old  patient lying in the bed with no acute distress.  EYES: Pupils equal, round, reactive to light and accommodation. No scleral icterus. Extraocular muscles intact.  HEENT: Head atraumatic, normocephalic. Oropharynx and nasopharynx clear.  NECK:  Supple, no jugular venous distention. No thyroid enlargement, no tenderness.  LUNGS: Normal breath sounds bilaterally, no wheezing, rales,rhonchi or crepitation. No use of accessory muscles of respiration.  CARDIOVASCULAR: S1, S2 normal. No murmurs, rubs, or gallops.  ABDOMEN: Soft, non-tender, non-distended. Bowel sounds present. No organomegaly or mass.  EXTREMITIES: No pedal edema, cyanosis, or clubbing.  NEUROLOGIC: Cranial nerves II through XII are intact. Muscle strength 5/5 in all extremities. Sensation intact. Gait not checked.  PSYCHIATRIC: The patient is alert and oriented x 3.  SKIN: No obvious rash, lesion, or ulcer.  DATA REVIEW:   CBC Recent Labs  Lab 11/14/17 2322  WBC 8.5  HGB 13.8  HCT 40.7  PLT 201    Chemistries  Recent Labs  Lab 11/14/17 2322 11/16/17 0549  NA 138 138  K 3.7 3.8  CL 99* 106  CO2 26 25  GLUCOSE 142* 122*  BUN 20 14  CREATININE 1.05* 0.78  CALCIUM 9.3 8.6*  MG  --  1.8  AST 24  --   ALT 17  --   ALKPHOS 64  --   BILITOT 0.8  --      Microbiology Results  No results found for this or any previous visit.  RADIOLOGY:  No results found.   Management plans discussed with the patient, family and they are in agreement.  CODE STATUS: Prior   TOTAL TIME TAKING CARE OF THIS PATIENT: 32 minutes.    Shaune PollackQing Corrine Tillis M.D on 11/16/2017 at 4:50 PM  Between 7am to 6pm - Pager - [phone]  After 6pm go to [email or web address] - Social research [email or web address], [email or web address] [email or web address]  [facility] [place] [facility]  Office  [phone]  CC: Primary care physician; Mick SellFitzgerald, David P, MD   Note: This dictation was prepared with Dragon dictation along with smaller phrase technology. Any transcriptional errors that result from this  process are unintentional.

## 2017-11-16 NOTE — Care Management Important Message (Signed)
Important Message  Patient Details  Name: Angel Harrison MRN: [id number] Date of Birth: 1951/03/02   Medicare Important Message Given:  N/A - LOS <3 / Initial given by admissions    Chapman FitchBOWEN, Aylani Spurlock T, RN 11/16/2017, 10:11 AM

## 2017-11-16 NOTE — Progress Notes (Signed)
Patient received discharge teaching and verbalized understanding. Patient belongings gathered from room and she will be transported home by her son.

## 2017-11-16 NOTE — Consult Note (Signed)
UROLOGY  No complaints this morning; pain resolved post procedure.  Not aware of passing any fragments yet.  Afebrile  Impression: Doing well status post shockwave lithotripsy for a left ureteral calculus.  Recommendation: 1.  Okay for discharge from urology standpoint 2.  Would discharge on tamsulosin and po prn pain meds. 3.  She has a follow-up scheduled with me on 12/14 at 2 PM

## 2017-11-30 ENCOUNTER — Ambulatory Visit
Admission: RE | Admit: 2017-11-30 | Discharge: 2017-11-30 | Disposition: A | Discharge status: Home / Self Care | Payer: Medicare Other | Source: Ambulatory Visit | Attending: Urology | Admitting: Urology

## 2017-11-30 ENCOUNTER — Encounter: Payer: Self-pay | Admitting: Urology

## 2017-11-30 ENCOUNTER — Ambulatory Visit (INDEPENDENT_AMBULATORY_CARE_PROVIDER_SITE_OTHER): Payer: Medicare Other | Admitting: Urology

## 2017-11-30 VITALS — BP 166/77 | HR 51 | Ht 59.0 in | Wt 183.0 lb

## 2017-11-30 DIAGNOSIS — N201 Calculus of ureter: Secondary | ICD-10-CM | POA: Diagnosis present

## 2017-11-30 DIAGNOSIS — Z9889 Other specified postprocedural states: Secondary | ICD-10-CM

## 2017-11-30 DIAGNOSIS — N1330 Unspecified hydronephrosis: Secondary | ICD-10-CM

## 2017-11-30 LAB — URINALYSIS, COMPLETE
Bilirubin, UA: NEGATIVE
Glucose, UA: NEGATIVE
Ketones, UA: NEGATIVE
LEUKOCYTES UA: NEGATIVE
NITRITE UA: NEGATIVE
Protein, UA: NEGATIVE
RBC, UA: NEGATIVE
Specific Gravity, UA: 1.025 (ref 1.005–1.030)
UUROB: 0.2 mg/dL (ref 0.2–1.0)
pH, UA: 5.5 (ref 5.0–7.5)

## 2017-11-30 NOTE — Progress Notes (Signed)
11/30/2017 4:15 PM   Angel Harrison 03-16-1951 [id number]  Referring provider: Mick SellFitzgerald, David P, MD [address] [place], [place] [id number]  Chief Complaint  Patient presents with  . Follow-up    ESWL    HPI: 66 year old female who was admitted the a.m. of 11/15/2017 with severe left renal colic secondary to a 9 mm left mid ureteral calculus.  She underwent lithotripsy that day and had resolution of her pain and was discharged the following day.  Her CT did show fairly significant hydronephrosis/hydroureter with some parenchymal thinning on that side suggesting chronic obstruction however her symptoms were acute in onset.  She had no problems after discharge and states she is doing well.  She passed multiple small fragments which she forgot to bring in today.  She denies flank or abdominal pain.  She has had 3 prior episodes of renal colic the last one was 15 years prior to this present one.  She states her stone was analyzed and was calcium oxalate.  KUB performed today was reviewed and the previously seen left mid ureteral calculus is no longer visualized.  There are multiple pelvic phleboliths present which appear stable when compared with prior KUB.   PMH: Past Medical History:  Diagnosis Date  . Arthritis   . Back pain   . HOH (hard of hearing)   . Hyperlipidemia   . Hypertension   . Kidney stone   . PONV (postoperative nausea and vomiting)   . Seasonal allergies   . Wears glasses     Surgical History: Past Surgical History:  Procedure Laterality Date  . ABDOMINAL HYSTERECTOMY  1999  . BREAST REDUCTION SURGERY Bilateral 05/19/2013   Procedure: BILATERAL BREAST REDUCTION  ;  Surgeon: Louisa SecondGerald Truesdale, MD;  Location: [facility];  Service: Plastics;  Laterality: Bilateral;  . CESAREAN SECTION    . CHOLECYSTECTOMY  1998   lap choli  . CYSTOSCOPY/RETROGRADE/URETEROSCOPY/STONE EXTRACTION WITH BASKET    . DILATION AND CURETTAGE OF UTERUS    .  EXTRACORPOREAL SHOCK WAVE LITHOTRIPSY Left 11/15/2017   Procedure: EXTRACORPOREAL SHOCK WAVE LITHOTRIPSY (ESWL);  Surgeon: Vanna ScotlandBrandon, Ashley, MD;  Location: [facility];  Service: Urology;  Laterality: Left;  . HAMMER TOE SURGERY  1997   right foot  . LITHOTRIPSY      Home Medications:  Allergies as of 11/30/2017      Reactions   Darvon [propoxyphene Hcl]    Dizzy-crazy      Medication List        Accurate as of 11/30/17  4:15 PM. Always use your most recent med list.          bisoprolol-hydrochlorothiazide 10-6.25 MG tablet Commonly known as:  ZIAC Take 1 tablet by mouth daily.   loratadine 10 MG tablet Commonly known as:  CLARITIN Take 10 mg by mouth as needed for allergies.   simvastatin 40 MG tablet Commonly known as:  ZOCOR Take 1 tablet by mouth every evening.   tamsulosin 0.4 MG Caps capsule Commonly known as:  FLOMAX Take 1 capsule (0.4 mg total) by mouth daily.       Allergies:  Allergies  Allergen Reactions  . Darvon [Propoxyphene Hcl]     Dizzy-crazy    Family History: Family History  Problem Relation Age of Onset  . CAD Father     Social History:  reports that  has never smoked. she has never used smokeless tobacco. She reports that she does not drink alcohol or use drugs.  ROS:  UROLOGY Frequent Urination?: No Hard to postpone urination?: No Burning/pain with urination?: No Get up at night to urinate?: No Leakage of urine?: No Urine stream starts and stops?: No Trouble starting stream?: No Do you have to strain to urinate?: No Blood in urine?: No Urinary tract infection?: No Sexually transmitted disease?: No Injury to kidneys or bladder?: No Painful intercourse?: No Weak stream?: No Currently pregnant?: No Vaginal bleeding?: No Last menstrual period?: n  Gastrointestinal Nausea?: No Vomiting?: No Indigestion/heartburn?: No Diarrhea?: No Constipation?: No  Constitutional Fever: No Night sweats?: No Weight loss?: No Fatigue?:  No  Skin Skin rash/lesions?: No Itching?: No  Eyes Blurred vision?: No Double vision?: No  Ears/Nose/Throat Sore throat?: No Sinus problems?: No  Hematologic/Lymphatic Swollen glands?: No Easy bruising?: No  Cardiovascular Leg swelling?: No Chest pain?: No  Respiratory Cough?: No Shortness of breath?: No  Endocrine Excessive thirst?: No  Musculoskeletal Back pain?: No Joint pain?: No  Neurological Headaches?: No Dizziness?: No  Psychologic Depression?: No Anxiety?: No  Physical Exam: BP (!) 166/77   Pulse (!) 51   Ht 4\' 11"  (1.499 m)   Wt 183 lb (83 kg)   BMI 36.96 kg/m   Constitutional:  Alert and oriented, No acute distress. HEENT: [place] AT, moist mucus membranes.  Trachea midline, no masses. Cardiovascular: No clubbing, cyanosis, or edema. Respiratory: Normal respiratory effort, no increased work of breathing. Skin: No rashes, bruises or suspicious lesions. Lymph: No cervical or inguinal adenopathy. Neurologic: Grossly intact, no focal deficits, moving all 4 extremities. Psychiatric: Normal mood and affect.  Laboratory Data: Lab Results  Component Value Date   WBC 8.5 11/14/2017   HGB 13.8 11/14/2017   HCT 40.7 11/14/2017   MCV 83.7 11/14/2017   PLT 201 11/14/2017    Urinalysis Negative dipstick and microscopy  Pertinent Imaging: KUB performed today reviewed   Assessment & Plan:    1. Status post urological surgery Good results status post shockwave lithotripsy of a 9 mm left mid ureteral calculus.  She is a recurrent stone former and I did recommend a metabolic evaluation.  She desires to hold off since her previous episode was 15 years ago.  I did recommend she increase her water intake to keep urine output greater than 2 L/day.  Dietary oxalate moderation was discussed and she was provided literature on general stone prevention guidelines.  Will obtain a follow-up renal ultrasound in approximately 4 weeks.  Follow-up in 6 months with a  KUB.  - Urinalysis, Complete  2. Hydronephrosis, unspecified hydronephrosis type  - Ultrasound renal complete; Future  Return in about 6 months (around 05/31/2018) for KUB.   Riki AltesScott C Stoioff, MD  [facility] [address], [address] [place], [place] [address] [phone]

## 2017-12-12 ENCOUNTER — Telehealth: Payer: Self-pay

## 2017-12-12 NOTE — Telephone Encounter (Signed)
Patient notified

## 2017-12-12 NOTE — Telephone Encounter (Signed)
-----   Message from Riki AltesScott C Stoioff, MD sent at 12/12/2017  7:29 AM EST ----- Please let patient know the radiologist interpreted her KUB as the stone still being present.  When compared with her previous x-ray I do not feel this is a stone.  If her ultrasound shows any abnormalities we may need to repeat a CT.

## 2017-12-26 ENCOUNTER — Ambulatory Visit
Admission: RE | Admit: 2017-12-26 | Discharge: 2017-12-26 | Disposition: A | Discharge status: Home / Self Care | Payer: Medicare Other | Source: Ambulatory Visit | Attending: Urology | Admitting: Urology

## 2017-12-26 DIAGNOSIS — N2889 Other specified disorders of kidney and ureter: Secondary | ICD-10-CM | POA: Diagnosis not present

## 2017-12-26 DIAGNOSIS — N1330 Unspecified hydronephrosis: Secondary | ICD-10-CM | POA: Diagnosis present

## 2017-12-28 ENCOUNTER — Telehealth: Payer: Self-pay

## 2017-12-28 NOTE — Telephone Encounter (Signed)
-----   Message from Riki AltesScott C Stoioff, MD sent at 12/27/2017  8:52 AM EST ----- RUS showed no significant abnormalities

## 2017-12-28 NOTE — Telephone Encounter (Signed)
Patient notified on vmail 

## 2018-01-02 ENCOUNTER — Encounter: Payer: Self-pay | Admitting: Urology

## 2018-01-02 ENCOUNTER — Ambulatory Visit (INDEPENDENT_AMBULATORY_CARE_PROVIDER_SITE_OTHER): Payer: Medicare Other | Admitting: Urology

## 2018-01-02 VITALS — BP 146/89 | HR 89 | Ht 59.0 in | Wt 170.0 lb

## 2018-01-02 DIAGNOSIS — Z87442 Personal history of urinary calculi: Secondary | ICD-10-CM

## 2018-01-02 NOTE — Progress Notes (Signed)
01/02/2018 12:23 PM   Angel Harrison [birth year] [id number]  Referring provider: Mick Sell, MD [address] [place], [place] [address]  Chief Complaint  Patient presents with  . Follow-up    RUS      HPI: 67 year old female presents for follow-up.  Refer to my prior note dated 11/30/2017.  She had her renal ultrasound and was scheduled for a follow-up appointment.  She has no complaints and remains asymptomatic.  Renal ultrasound showed no calculi and resolution of her hydronephrosis.   PMH: Past Medical History:  Diagnosis Date  . Arthritis   . Back pain   . HOH (hard of hearing)   . Hyperlipidemia   . Hypertension   . Kidney stone   . PONV (postoperative nausea and vomiting)   . Seasonal allergies   . Wears glasses     Surgical History: Past Surgical History:  Procedure Laterality Date  . ABDOMINAL HYSTERECTOMY  1999  . BREAST REDUCTION SURGERY Bilateral 05/19/2013   Procedure: BILATERAL BREAST REDUCTION  ;  Surgeon: Louisa Second, MD;  Location: [facility];  Service: Plastics;  Laterality: Bilateral;  . CESAREAN SECTION    . CHOLECYSTECTOMY  1998   lap choli  . CYSTOSCOPY/RETROGRADE/URETEROSCOPY/STONE EXTRACTION WITH BASKET    . DILATION AND CURETTAGE OF UTERUS    . EXTRACORPOREAL SHOCK WAVE LITHOTRIPSY Left 11/15/2017   Procedure: EXTRACORPOREAL SHOCK WAVE LITHOTRIPSY (ESWL);  Surgeon: Vanna Scotland, MD;  Location: [facility];  Service: Urology;  Laterality: Left;  . HAMMER TOE SURGERY  1997   right foot  . LITHOTRIPSY      Home Medications:  Allergies as of 01/02/2018      Reactions   Darvon [propoxyphene Hcl]    Dizzy-crazy      Medication List        Accurate as of 01/02/18 12:23 PM. Always use your most recent med list.          bisoprolol-hydrochlorothiazide 10-6.25 MG tablet Commonly known as:  ZIAC Take 1 tablet by mouth daily.   loratadine 10 MG tablet Commonly known as:  CLARITIN Take 10 mg by  mouth as needed for allergies.   simvastatin 40 MG tablet Commonly known as:  ZOCOR Take 1 tablet by mouth every evening.   tamsulosin 0.4 MG Caps capsule Commonly known as:  FLOMAX Take 1 capsule (0.4 mg total) by mouth daily.       Allergies:  Allergies  Allergen Reactions  . Darvon [Propoxyphene Hcl]     Dizzy-crazy    Family History: Family History  Problem Relation Age of Onset  . CAD Father     Social History:  reports that  has never smoked. she has never used smokeless tobacco. She reports that she does not drink alcohol or use drugs.  ROS: UROLOGY Frequent Urination?: No Hard to postpone urination?: No Burning/pain with urination?: No Get up at night to urinate?: No Leakage of urine?: Yes Urine stream starts and stops?: No Trouble starting stream?: No Do you have to strain to urinate?: No Blood in urine?: No Urinary tract infection?: No Sexually transmitted disease?: No Injury to kidneys or bladder?: No Painful intercourse?: No Weak stream?: No Currently pregnant?: No Vaginal bleeding?: No Last menstrual period?: n  Gastrointestinal Nausea?: No Vomiting?: No Indigestion/heartburn?: No Diarrhea?: No Constipation?: No  Constitutional Fever: No Night sweats?: No Weight loss?: No Fatigue?: No  Skin Skin rash/lesions?: No Itching?: No  Eyes Blurred vision?: No Double vision?: No  Ears/Nose/Throat Sore  throat?: No Sinus problems?: No  Hematologic/Lymphatic Swollen glands?: No Easy bruising?: No  Cardiovascular Leg swelling?: No Chest pain?: No  Respiratory Cough?: No Shortness of breath?: No  Endocrine Excessive thirst?: No  Musculoskeletal Back pain?: No Joint pain?: No  Neurological Headaches?: No Dizziness?: No  Psychologic Depression?: No Anxiety?: No  Physical Exam: BP (!) 146/89   Pulse 89   Ht 4\' 11"  (1.499 m)   Wt 170 lb (77.1 kg)   BMI 34.34 kg/m   Constitutional:  Alert and oriented, No acute  distress. HEENT: [place] AT, moist mucus membranes.  Trachea midline, no masses. Cardiovascular: No clubbing, cyanosis, or edema. Respiratory: Normal respiratory effort, no increased work of breathing. GI: Abdomen is soft, nontender, nondistended, no abdominal masses GU: No CVA tenderness.   Skin: No rashes, bruises or suspicious lesions. Lymph: No cervical or inguinal adenopathy. Neurologic: Grossly intact, no focal deficits, moving all 4 extremities. Psychiatric: Normal mood and affect.  Laboratory Data: Lab Results  Component Value Date   WBC 8.5 11/14/2017   HGB 13.8 11/14/2017   HCT 40.7 11/14/2017   MCV 83.7 11/14/2017   PLT 201 11/14/2017    Lab Results  Component Value Date   CREATININE 0.78 11/16/2017    Urinalysis Lab Results  Component Value Date   SPECGRAV 1.025 11/30/2017   PHUR 5.5 11/30/2017   COLORU Yellow 11/30/2017   APPEARANCEUR Clear 11/30/2017   LEUKOCYTESUR Negative 11/30/2017   PROTEINUR Negative 11/30/2017   GLUCOSEU Negative 11/30/2017   KETONESU Negative 11/30/2017   RBCU Negative 11/30/2017   BILIRUBINUR Negative 11/30/2017   UUROB 0.2 11/30/2017   NITRITE Negative 11/30/2017    Lab Results  Component Value Date   BACTERIA NONE SEEN 11/14/2017    Pertinent Imaging: Images were personally reviewed Results for orders placed during the hospital encounter of 12/26/17  Ultrasound renal complete   Narrative CLINICAL DATA:  Hydronephrosis.  EXAM: RENAL / URINARY TRACT ULTRASOUND COMPLETE  COMPARISON:  Body CT 11/15/2017  FINDINGS: Right Kidney:  Length: 11.6 cm. Echogenicity within normal limits. No mass or hydronephrosis visualized.  Left Kidney:  Length: 11.7 cm. Echogenicity within normal limits. Mild left pelviectasis.  Bladder:  Appears normal for degree of bladder distention. Bilateral ureteral jets seen.  IMPRESSION: Mild left pelviectasis, otherwise normal renal ultrasound.   Electronically Signed   By: Ted Mcalpineobrinka   Dimitrova M.D.   On: 12/26/2017 18:25     Assessment & Plan:   Resolution of her severe hydronephrosis status post shockwave lithotripsy.  She elected not to pursue a metabolic evaluation.  Follow-up 6 months with a KUB.   Return in about 6 months (around 07/02/2018) for KUB.  Riki AltesScott C Tynia Wiers, MD  [facility] [address], [address] [place], [place] [address] [phone]

## 2018-05-01 DIAGNOSIS — E7801 Familial hypercholesterolemia: Secondary | ICD-10-CM | POA: Insufficient documentation

## 2018-05-01 DIAGNOSIS — N200 Calculus of kidney: Secondary | ICD-10-CM | POA: Insufficient documentation

## 2018-05-01 DIAGNOSIS — I10 Essential (primary) hypertension: Secondary | ICD-10-CM | POA: Insufficient documentation

## 2018-05-01 DIAGNOSIS — E78019 Familial hypercholesterolemia, unspecified: Secondary | ICD-10-CM | POA: Insufficient documentation

## 2018-07-03 ENCOUNTER — Ambulatory Visit: Payer: Medicare Other | Admitting: Urology

## 2018-07-04 ENCOUNTER — Encounter: Payer: Self-pay | Admitting: Urology

## 2018-09-12 ENCOUNTER — Telehealth: Payer: Self-pay | Admitting: Urology

## 2018-09-12 DIAGNOSIS — Z87442 Personal history of urinary calculi: Secondary | ICD-10-CM

## 2018-09-12 NOTE — Telephone Encounter (Signed)
This patient has a follow up app next week but there in not an order for her KUB   Thanks, Marcelino Duster

## 2018-09-13 ENCOUNTER — Ambulatory Visit
Admission: RE | Admit: 2018-09-13 | Discharge: 2018-09-13 | Disposition: A | Discharge status: Home / Self Care | Payer: Medicare Other | Source: Ambulatory Visit | Attending: Urology | Admitting: Urology

## 2018-09-13 DIAGNOSIS — N2889 Other specified disorders of kidney and ureter: Secondary | ICD-10-CM | POA: Diagnosis not present

## 2018-09-13 DIAGNOSIS — Z87442 Personal history of urinary calculi: Secondary | ICD-10-CM

## 2018-09-13 NOTE — Telephone Encounter (Signed)
Order was entered 

## 2018-09-17 ENCOUNTER — Encounter: Payer: Self-pay | Admitting: Urology

## 2018-09-17 ENCOUNTER — Ambulatory Visit (INDEPENDENT_AMBULATORY_CARE_PROVIDER_SITE_OTHER): Payer: Medicare Other | Admitting: Urology

## 2018-09-17 VITALS — BP 173/80 | HR 64 | Resp 16 | Ht 59.0 in | Wt 174.0 lb

## 2018-09-17 DIAGNOSIS — Z87442 Personal history of urinary calculi: Secondary | ICD-10-CM

## 2018-09-17 NOTE — Progress Notes (Signed)
09/17/2018 2:57 PM   [name] 22-Apr-1951 [id number]  Referring provider: Mick Sell, MD [address] [facility] - INTERNAL MEDICINE [place], [place] [address]  Chief Complaint  Patient presents with  . Nephrolithiasis    follow up after xray    HPI: 67 year old female with a history of stone disease status post shockwave lithotripsy November 2018 for a 9 mm left mid ureteral calculus.  Follow-up imaging showed resolution of her hydronephrosis and she was asymptomatic.  She presents for a six-month follow-up and remains asymptomatic.  She specifically denies flank, abdominal or pelvic pain.  She denies gross hematuria.  She does note increased urinary frequency and urgency associated with her increased hydration.  KUB performed today was reviewed.  There is a linear density overlying the left 12th rib however this was present on a prior KUB November 2018 and was not in the kidney on her CT.   PMH: Past Medical History:  Diagnosis Date  . Arthritis   . Back pain   . HOH (hard of hearing)   . Hyperlipidemia   . Hypertension   . Kidney stone   . PONV (postoperative nausea and vomiting)   . Seasonal allergies   . Wears glasses     Surgical History: Past Surgical History:  Procedure Laterality Date  . ABDOMINAL HYSTERECTOMY  1999  . BREAST REDUCTION SURGERY Bilateral 05/19/2013   Procedure: BILATERAL BREAST REDUCTION  ;  Surgeon: Louisa Second, MD;  Location: [facility];  Service: Plastics;  Laterality: Bilateral;  . CESAREAN SECTION    . CHOLECYSTECTOMY  1998   lap choli  . CYSTOSCOPY/RETROGRADE/URETEROSCOPY/STONE EXTRACTION WITH BASKET    . DILATION AND CURETTAGE OF UTERUS    . EXTRACORPOREAL SHOCK WAVE LITHOTRIPSY Left 11/15/2017   Procedure: EXTRACORPOREAL SHOCK WAVE LITHOTRIPSY (ESWL);  Surgeon: Vanna Scotland, MD;  Location: [facility];  Service: Urology;  Laterality: Left;  . HAMMER TOE SURGERY  1997   right foot  .  LITHOTRIPSY      Home Medications:  Allergies as of 09/17/2018      Reactions   Darvon [propoxyphene Hcl]    Dizzy-crazy      Medication List        Accurate as of 09/17/18  2:57 PM. Always use your most recent med list.          bisoprolol-hydrochlorothiazide 10-6.25 MG tablet Commonly known as:  ZIAC Take 1 tablet by mouth daily.   loratadine 10 MG tablet Commonly known as:  CLARITIN Take 10 mg by mouth as needed for allergies.   simvastatin 40 MG tablet Commonly known as:  ZOCOR Take 1 tablet by mouth every evening.   tamsulosin 0.4 MG Caps capsule Commonly known as:  FLOMAX Take 1 capsule (0.4 mg total) by mouth daily.       Allergies:  Allergies  Allergen Reactions  . Darvon [Propoxyphene Hcl]     Dizzy-crazy    Family History: Family History  Problem Relation Age of Onset  . CAD Father     Social History:  reports that she has never smoked. She has never used smokeless tobacco. She reports that she does not drink alcohol or use drugs.  ROS: UROLOGY Frequent Urination?: No Hard to postpone urination?: No Burning/pain with urination?: No Get up at night to urinate?: No Leakage of urine?: No Urine stream starts and stops?: No Trouble starting stream?: No Do you have to strain to urinate?: No Blood in urine?: No Urinary  tract infection?: No Sexually transmitted disease?: No Injury to kidneys or bladder?: No Painful intercourse?: No Weak stream?: No Currently pregnant?: No Vaginal bleeding?: No Last menstrual period?: n  Gastrointestinal Nausea?: No Vomiting?: No Indigestion/heartburn?: No Diarrhea?: No Constipation?: No  Constitutional Fever: No Night sweats?: No Weight loss?: No Fatigue?: No  Skin Skin rash/lesions?: No Itching?: No  Eyes Blurred vision?: No Double vision?: No  Ears/Nose/Throat Sore throat?: No Sinus problems?: No  Hematologic/Lymphatic Swollen glands?: No Easy bruising?: No  Cardiovascular Leg  swelling?: No Chest pain?: No  Respiratory Cough?: No Shortness of breath?: No  Endocrine Excessive thirst?: No  Musculoskeletal Back pain?: No Joint pain?: No  Neurological Headaches?: No Dizziness?: No  Psychologic Depression?: No Anxiety?: No  Physical Exam: BP (!) 173/80   Pulse 64   Resp 16   Ht 4\' 11"  (1.499 m)   Wt 174 lb (78.9 kg)   BMI 35.14 kg/m   Constitutional:  Alert and oriented, No acute distress. HEENT: [place] AT, moist mucus membranes.  Trachea midline, no masses. Cardiovascular: No clubbing, cyanosis, or edema. Respiratory: Normal respiratory effort, no increased work of breathing. GI: Abdomen is soft, nontender, nondistended, no abdominal masses GU: No CVA tenderness Lymph: No cervical or inguinal lymphadenopathy. Skin: No rashes, bruises or suspicious lesions. Neurologic: Grossly intact, no focal deficits, moving all 4 extremities. Psychiatric: Normal mood and affect.    Pertinent Imaging: Image was personally reviewed.  The linear density is not in the kidney. Results for orders placed during the hospital encounter of 09/13/18  Abdomen 1 view (KUB)   Narrative CLINICAL DATA:  History of nephrolithiasis  EXAM: ABDOMEN - 1 VIEW  COMPARISON:  November 30, 2017.  FINDINGS: There is a 4 x 2 mm calcification either in or overlying the upper pole of the left kidney. There are apparent phleboliths in the pelvis. There are surgical clips in the right upper quadrant.  There is moderate stool in the colon. There is no bowel dilatation or air-fluid level to suggest bowel obstruction. No free air. Lung bases clear.  IMPRESSION: 4 x 2 mm calcification in or overlying the upper pole of the left kidney. Phleboliths in the pelvis. No bowel obstruction or free air. Lung bases are clear.   Electronically Signed   By: Bretta Bang III M.D.   On: 09/14/2018 08:56      Assessment & Plan:   No evidence of recurrent stone disease.  Continue  general stone prevention guidelines as previously discussed.  Follow-up 1 year with a KUB.   [name], MD  [facility] [address] [place], [place] [address] [phone]

## 2018-11-05 ENCOUNTER — Ambulatory Visit
Admission: RE | Admit: 2018-11-05 | Discharge: 2018-11-05 | Disposition: A | Discharge status: Home / Self Care | Payer: Medicare Other | Source: Ambulatory Visit | Attending: Surgery | Admitting: Surgery

## 2018-11-05 ENCOUNTER — Encounter
Admission: RE | Disposition: A | Discharge status: Home / Self Care | Payer: Self-pay | Source: Ambulatory Visit | Attending: Surgery

## 2018-11-05 ENCOUNTER — Ambulatory Visit: Payer: Medicare Other

## 2018-11-05 ENCOUNTER — Ambulatory Visit: Payer: Medicare Other | Admitting: Anesthesiology

## 2018-11-05 ENCOUNTER — Encounter: Payer: Self-pay | Admitting: *Deleted

## 2018-11-05 ENCOUNTER — Other Ambulatory Visit: Payer: Self-pay

## 2018-11-05 DIAGNOSIS — Z419 Encounter for procedure for purposes other than remedying health state, unspecified: Secondary | ICD-10-CM

## 2018-11-05 DIAGNOSIS — Y9289 Other specified places as the place of occurrence of the external cause: Secondary | ICD-10-CM | POA: Insufficient documentation

## 2018-11-05 DIAGNOSIS — E785 Hyperlipidemia, unspecified: Secondary | ICD-10-CM | POA: Diagnosis not present

## 2018-11-05 DIAGNOSIS — S82831A Other fracture of upper and lower end of right fibula, initial encounter for closed fracture: Secondary | ICD-10-CM | POA: Insufficient documentation

## 2018-11-05 DIAGNOSIS — E119 Type 2 diabetes mellitus without complications: Secondary | ICD-10-CM | POA: Insufficient documentation

## 2018-11-05 DIAGNOSIS — W010XXA Fall on same level from slipping, tripping and stumbling without subsequent striking against object, initial encounter: Secondary | ICD-10-CM | POA: Insufficient documentation

## 2018-11-05 DIAGNOSIS — S8261XA Displaced fracture of lateral malleolus of right fibula, initial encounter for closed fracture: Secondary | ICD-10-CM | POA: Insufficient documentation

## 2018-11-05 DIAGNOSIS — I10 Essential (primary) hypertension: Secondary | ICD-10-CM | POA: Diagnosis not present

## 2018-11-05 DIAGNOSIS — Z79899 Other long term (current) drug therapy: Secondary | ICD-10-CM | POA: Diagnosis not present

## 2018-11-05 DIAGNOSIS — Z7984 Long term (current) use of oral hypoglycemic drugs: Secondary | ICD-10-CM | POA: Insufficient documentation

## 2018-11-05 HISTORY — PX: ORIF ANKLE FRACTURE: SHX5408

## 2018-11-05 SURGERY — OPEN REDUCTION INTERNAL FIXATION (ORIF) ANKLE FRACTURE
Anesthesia: General | Site: Ankle | Laterality: Right

## 2018-11-05 MED ORDER — OXYCODONE HCL 5 MG PO TABS: 5.0000 mg | ORAL_TABLET | ORAL | 0 refills | Status: DC | PRN

## 2018-11-05 MED ORDER — ESMOLOL HCL 100 MG/10ML IV SOLN
INTRAVENOUS | Status: DC | PRN
  Administered 2018-11-05: 50000 ug via INTRAVENOUS

## 2018-11-05 MED ORDER — BUPIVACAINE HCL (PF) 0.5 % IJ SOLN
INTRAMUSCULAR | Status: DC | PRN
  Administered 2018-11-05: 20 mL via PERINEURAL
  Administered 2018-11-05: 10 mL via PERINEURAL

## 2018-11-05 MED ORDER — FENTANYL CITRATE (PF) 100 MCG/2ML IJ SOLN
INTRAMUSCULAR | Status: DC | PRN
  Administered 2018-11-05: 50 ug via INTRAVENOUS
  Administered 2018-11-05: 100 ug via INTRAVENOUS
  Administered 2018-11-05: 50 ug via INTRAVENOUS

## 2018-11-05 MED ORDER — POTASSIUM CHLORIDE IN NACL 20-0.9 MEQ/L-% IV SOLN
INTRAVENOUS | Status: DC
  Filled 2018-11-05 (×5): qty 1000

## 2018-11-05 MED ORDER — FENTANYL CITRATE (PF) 100 MCG/2ML IJ SOLN
50.0000 ug | Freq: Once | INTRAMUSCULAR | Status: AC
  Administered 2018-11-05: 50 ug via INTRAVENOUS

## 2018-11-05 MED ORDER — METOCLOPRAMIDE HCL 5 MG/ML IJ SOLN: 5.0000 mg | Freq: Three times a day (TID) | INTRAMUSCULAR | Status: DC | PRN

## 2018-11-05 MED ORDER — SUGAMMADEX SODIUM 200 MG/2ML IV SOLN
INTRAVENOUS | Status: DC | PRN
  Administered 2018-11-05: 200 mg via INTRAVENOUS

## 2018-11-05 MED ORDER — FENTANYL CITRATE (PF) 100 MCG/2ML IJ SOLN
INTRAMUSCULAR | Status: AC
  Filled 2018-11-05: qty 2

## 2018-11-05 MED ORDER — PROPOFOL 10 MG/ML IV BOLUS
INTRAVENOUS | Status: DC | PRN
  Administered 2018-11-05: 50 mg via INTRAVENOUS
  Administered 2018-11-05: 150 mg via INTRAVENOUS

## 2018-11-05 MED ORDER — LIDOCAINE HCL (PF) 2 % IJ SOLN
INTRAMUSCULAR | Status: AC
  Filled 2018-11-05: qty 10

## 2018-11-05 MED ORDER — DEXAMETHASONE SODIUM PHOSPHATE 4 MG/ML IJ SOLN
INTRAMUSCULAR | Status: DC | PRN
  Administered 2018-11-05: 5 mg via INTRAVENOUS

## 2018-11-05 MED ORDER — MIDAZOLAM HCL 2 MG/2ML IJ SOLN
1.0000 mg | Freq: Once | INTRAMUSCULAR | Status: AC
  Administered 2018-11-05: 1 mg via INTRAVENOUS

## 2018-11-05 MED ORDER — CEFAZOLIN SODIUM-DEXTROSE 2-4 GM/100ML-% IV SOLN
2.0000 g | Freq: Once | INTRAVENOUS | Status: AC
  Administered 2018-11-05: 2 g via INTRAVENOUS

## 2018-11-05 MED ORDER — BUPIVACAINE HCL (PF) 0.5 % IJ SOLN
INTRAMUSCULAR | Status: AC
  Filled 2018-11-05: qty 10

## 2018-11-05 MED ORDER — HYDRALAZINE HCL 20 MG/ML IJ SOLN
INTRAMUSCULAR | Status: DC | PRN
  Administered 2018-11-05: 10 mg via INTRAVENOUS

## 2018-11-05 MED ORDER — ONDANSETRON HCL 4 MG/2ML IJ SOLN: 4.0000 mg | Freq: Four times a day (QID) | INTRAMUSCULAR | Status: DC | PRN

## 2018-11-05 MED ORDER — ONDANSETRON HCL 4 MG/2ML IJ SOLN
INTRAMUSCULAR | Status: AC
  Filled 2018-11-05: qty 2

## 2018-11-05 MED ORDER — FENTANYL CITRATE (PF) 100 MCG/2ML IJ SOLN
25.0000 ug | INTRAMUSCULAR | Status: DC | PRN
  Administered 2018-11-05: 25 ug via INTRAVENOUS

## 2018-11-05 MED ORDER — CEFAZOLIN SODIUM-DEXTROSE 2-4 GM/100ML-% IV SOLN
INTRAVENOUS | Status: AC
  Filled 2018-11-05: qty 100

## 2018-11-05 MED ORDER — ONDANSETRON HCL 4 MG/2ML IJ SOLN
INTRAMUSCULAR | Status: DC | PRN
  Administered 2018-11-05: 4 mg via INTRAVENOUS

## 2018-11-05 MED ORDER — ONDANSETRON HCL 4 MG/2ML IJ SOLN
4.0000 mg | Freq: Once | INTRAMUSCULAR | Status: AC
  Administered 2018-11-05: 4 mg via INTRAVENOUS

## 2018-11-05 MED ORDER — ROCURONIUM BROMIDE 50 MG/5ML IV SOLN
INTRAVENOUS | Status: AC
  Filled 2018-11-05: qty 1

## 2018-11-05 MED ORDER — LIDOCAINE HCL (PF) 1 % IJ SOLN
INTRAMUSCULAR | Status: AC
  Filled 2018-11-05: qty 5

## 2018-11-05 MED ORDER — MIDAZOLAM HCL 2 MG/2ML IJ SOLN
INTRAMUSCULAR | Status: AC
  Filled 2018-11-05: qty 2

## 2018-11-05 MED ORDER — MIDAZOLAM HCL 2 MG/2ML IJ SOLN
INTRAMUSCULAR | Status: AC
  Administered 2018-11-05: 1 mg via INTRAVENOUS
  Filled 2018-11-05: qty 2

## 2018-11-05 MED ORDER — OXYCODONE HCL 5 MG/5ML PO SOLN: 5.0000 mg | Freq: Once | ORAL | Status: DC | PRN

## 2018-11-05 MED ORDER — LACTATED RINGERS IV SOLN
INTRAVENOUS | Status: DC
  Administered 2018-11-05 (×2): via INTRAVENOUS

## 2018-11-05 MED ORDER — FENTANYL CITRATE (PF) 100 MCG/2ML IJ SOLN
INTRAMUSCULAR | Status: AC
  Administered 2018-11-05: 50 ug via INTRAVENOUS
  Filled 2018-11-05: qty 2

## 2018-11-05 MED ORDER — OXYCODONE HCL 5 MG PO TABS: 5.0000 mg | ORAL_TABLET | ORAL | Status: DC | PRN

## 2018-11-05 MED ORDER — METOCLOPRAMIDE HCL 10 MG PO TABS: 5.0000 mg | ORAL_TABLET | Freq: Three times a day (TID) | ORAL | Status: DC | PRN

## 2018-11-05 MED ORDER — ONDANSETRON HCL 4 MG PO TABS: 4.0000 mg | ORAL_TABLET | Freq: Four times a day (QID) | ORAL | Status: DC | PRN

## 2018-11-05 MED ORDER — NEOMYCIN-POLYMYXIN B GU 40-200000 IR SOLN
Status: DC | PRN
  Administered 2018-11-05: 4 mL

## 2018-11-05 MED ORDER — PROPOFOL 10 MG/ML IV BOLUS
INTRAVENOUS | Status: AC
  Filled 2018-11-05: qty 20

## 2018-11-05 MED ORDER — OXYCODONE HCL 5 MG PO TABS: 5.0000 mg | ORAL_TABLET | Freq: Once | ORAL | Status: DC | PRN

## 2018-11-05 MED ORDER — LIDOCAINE HCL (CARDIAC) PF 100 MG/5ML IV SOSY
PREFILLED_SYRINGE | INTRAVENOUS | Status: DC | PRN
  Administered 2018-11-05: 100 mg via INTRAVENOUS

## 2018-11-05 MED ORDER — LIDOCAINE HCL (PF) 1 % IJ SOLN
INTRAMUSCULAR | Status: DC | PRN
  Administered 2018-11-05: 1 mL

## 2018-11-05 MED ORDER — ROCURONIUM BROMIDE 100 MG/10ML IV SOLN
INTRAVENOUS | Status: DC | PRN
  Administered 2018-11-05: 10 mg via INTRAVENOUS
  Administered 2018-11-05: 40 mg via INTRAVENOUS

## 2018-11-05 SURGICAL SUPPLY — 58 items
BANDAGE ACE 3X5.8 VEL STRL LF (GAUZE/BANDAGES/DRESSINGS) ×3 IMPLANT
BANDAGE ACE 4X5 VEL STRL LF (GAUZE/BANDAGES/DRESSINGS) ×3 IMPLANT
BANDAGE ACE 6X5 VEL STRL LF (GAUZE/BANDAGES/DRESSINGS) IMPLANT
BIT DRILL 2.5X2.75 QC CALB (BIT) ×3 IMPLANT
BIT DRILL 3.5X5.5 QC CALB (BIT) ×3 IMPLANT
BIT DRILL CALIBRATED 2.7 (BIT) ×2 IMPLANT
BIT DRILL CALIBRATED 2.7MM (BIT) ×1
BLADE SURG SZ10 CARB STEEL (BLADE) ×6 IMPLANT
BNDG COHESIVE 4X5 TAN STRL (GAUZE/BANDAGES/DRESSINGS) ×3 IMPLANT
BNDG ESMARK 6X12 TAN STRL LF (GAUZE/BANDAGES/DRESSINGS) ×3 IMPLANT
BNDG PLASTER FAST 4X5 WHT LF (CAST SUPPLIES) IMPLANT
CANISTER SUCT 1200ML W/VALVE (MISCELLANEOUS) ×3 IMPLANT
CHLORAPREP W/TINT 26ML (MISCELLANEOUS) ×6 IMPLANT
COVER WAND RF STERILE (DRAPES) IMPLANT
CUFF TOURN 18 STER (MISCELLANEOUS) ×3 IMPLANT
CUFF TOURN 24 STER (MISCELLANEOUS) IMPLANT
CUFF TOURN 30 STER DUAL PORT (MISCELLANEOUS) IMPLANT
DRAPE C-ARM XRAY 36X54 (DRAPES) ×3 IMPLANT
DRAPE C-ARMOR (DRAPES) ×3 IMPLANT
DRAPE INCISE IOBAN 66X45 STRL (DRAPES) ×3 IMPLANT
DRAPE U-SHAPE 47X51 STRL (DRAPES) ×3 IMPLANT
ELECT CAUTERY BLADE 6.4 (BLADE) ×3 IMPLANT
ELECT REM PT RETURN 9FT ADLT (ELECTROSURGICAL) ×3
ELECTRODE REM PT RTRN 9FT ADLT (ELECTROSURGICAL) ×1 IMPLANT
GAUZE PETRO XEROFOAM 1X8 (MISCELLANEOUS) ×3 IMPLANT
GAUZE SPONGE 4X4 12PLY STRL (GAUZE/BANDAGES/DRESSINGS) ×3 IMPLANT
GLOVE BIO SURGEON STRL SZ8 (GLOVE) ×6 IMPLANT
GLOVE INDICATOR 8.0 STRL GRN (GLOVE) ×3 IMPLANT
GOWN STRL REUS W/ TWL LRG LVL3 (GOWN DISPOSABLE) ×1 IMPLANT
GOWN STRL REUS W/ TWL XL LVL3 (GOWN DISPOSABLE) ×1 IMPLANT
GOWN STRL REUS W/TWL LRG LVL3 (GOWN DISPOSABLE) ×2
GOWN STRL REUS W/TWL XL LVL3 (GOWN DISPOSABLE) ×2
HEMOVAC 400ML (MISCELLANEOUS)
K-WIRE ACE 1.6X6 (WIRE) ×3
KIT DRAIN HEMOVAC JP 7FR 400ML (MISCELLANEOUS) IMPLANT
KIT TURNOVER KIT A (KITS) ×3 IMPLANT
KWIRE ACE 1.6X6 (WIRE) ×1 IMPLANT
LABEL OR SOLS (LABEL) ×3 IMPLANT
NS IRRIG 1000ML POUR BTL (IV SOLUTION) ×3 IMPLANT
PACK EXTREMITY ARMC (MISCELLANEOUS) ×3 IMPLANT
PAD ABD DERMACEA PRESS 5X9 (GAUZE/BANDAGES/DRESSINGS) IMPLANT
PAD CAST CTTN 4X4 STRL (SOFTGOODS) ×2 IMPLANT
PAD PREP 24X41 OB/GYN DISP (PERSONAL CARE ITEMS) ×3 IMPLANT
PADDING CAST COTTON 4X4 STRL (SOFTGOODS) ×4
PLATE LOCK 7H 92 BILAT FIB (Plate) ×3 IMPLANT
SCREW CORTICAL 3.5MM 18MM (Screw) ×3 IMPLANT
SCREW LOCK 3.5X10 DIST TIB (Screw) ×3 IMPLANT
SCREW LOCK CORT STAR 3.5X10 (Screw) ×3 IMPLANT
SCREW LOCK CORT STAR 3.5X12 (Screw) ×3 IMPLANT
SCREW LOW PROFILE 12MMX3.5MM (Screw) ×3 IMPLANT
SCREW NON LOCKING LP 3.5 14MM (Screw) ×6 IMPLANT
SPONGE LAP 18X18 RF (DISPOSABLE) IMPLANT
STAPLER SKIN PROX 35W (STAPLE) ×3 IMPLANT
STOCKINETTE IMPERV 14X48 (MISCELLANEOUS) ×3 IMPLANT
SUT VIC AB 0 CT1 36 (SUTURE) ×3 IMPLANT
SUT VIC AB 2-0 SH 27 (SUTURE) ×4
SUT VIC AB 2-0 SH 27XBRD (SUTURE) ×2 IMPLANT
SYR 10ML LL (SYRINGE) ×3 IMPLANT

## 2018-11-05 NOTE — Discharge Instructions (Addendum)
Orthopedic discharge instructions: Keep dressing/splint dry and intact.  Apply ice frequently to ankle. Take ibuprofen 600 mg TID with meals for 7-10 days, then as necessary. Take oxycodone as prescribed when needed.  May supplement with ES Tylenol if necessary. No weight-bearing on right foot - use walker or crutches. Follow-up in 10-14 days or as scheduled.  AMBULATORY SURGERY  DISCHARGE INSTRUCTIONS   1) The drugs that you were given will stay in your system until tomorrow so for the next 24 hours you should not:  A) Drive an automobile B) Make any legal decisions C) Drink any alcoholic beverage   2) You may resume regular meals tomorrow.  Today it is better to start with liquids and gradually work up to solid foods.  You may eat anything you prefer, but it is better to start with liquids, then soup and crackers, and gradually work up to solid foods.   3) Please notify your doctor immediately if you have any unusual bleeding, trouble breathing, redness and pain at the surgery site, drainage, fever, or pain not relieved by medication.    4) Additional Instructions:        Please contact your physician with any problems or Same Day Surgery at [phone], Monday through Friday 6 am to 4 pm, or [place] at [facility] Main number at [phone].

## 2018-11-05 NOTE — OR Nursing (Signed)
Dr. Shela CommonsJ [name] into see patient. She has agreed to a nerve block. Patient moved to room 24. Bisoprolol held per Dr. Randa NgoPiscitello due to low heart rate.

## 2018-11-05 NOTE — H&P (Signed)
Paper H&P to be scanned into permanent record. H&P reviewed and patient re-examined. No changes. 

## 2018-11-05 NOTE — Anesthesia Post-op Follow-up Note (Signed)
Anesthesia QCDR form completed.        

## 2018-11-05 NOTE — Anesthesia Procedure Notes (Signed)
Anesthesia Regional Block: Popliteal block   Pre-Anesthetic Checklist: ,, timeout performed, Correct Patient, Correct Site, Correct Laterality, Correct Procedure, Correct Position, site marked, Risks and benefits discussed,  Surgical consent,  Pre-op evaluation,  At surgeon's request and post-op pain management  Laterality: Lower and Right  Prep: chloraprep       Needles:  Injection technique: Single-shot  Needle Type: Echogenic Needle     Needle Length: 9cm  Needle Gauge: 21     Additional Needles:   Procedures:,,,, ultrasound used (permanent image in chart),,,,  Narrative:  Start time: 11/05/2018 12:20 PM End time: 11/05/2018 12:29 PM Injection made incrementally with aspirations every 5 mL.  Performed by: Personally  Anesthesiologist: , Cleda MccreedyJoseph K, MD  Additional Notes: Functioning IV was confirmed and monitors were applied.  A echogenic needle was used. Sterile prep,hand hygiene and sterile gloves were used. Minimal sedation used for procedure.   No paresthesia endorsed by patient during the procedure.  Negative aspiration and negative test dose prior to incremental administration of local anesthetic. The patient tolerated the procedure well with no immediate complications.

## 2018-11-05 NOTE — Anesthesia Preprocedure Evaluation (Signed)
Anesthesia Evaluation  Patient identified by MRN, date of birth, ID band Patient awake    Reviewed: Allergy & Precautions, H&P , NPO status , Patient's Chart, lab work & pertinent test results  History of Anesthesia Complications (+) PONV and history of anesthetic complications  Airway Mallampati: II  TM Distance: <3 FB Neck ROM: full    Dental  (+) Chipped   Pulmonary neg pulmonary ROS, neg shortness of breath,           Cardiovascular Exercise Tolerance: Good hypertension,      Neuro/Psych negative neurological ROS  negative psych ROS   GI/Hepatic negative GI ROS, Neg liver ROS, neg GERD  ,  Endo/Other  negative endocrine ROS  Renal/GU Renal disease     Musculoskeletal  (+) Arthritis ,   Abdominal   Peds  Hematology negative hematology ROS (+)   Anesthesia Other Findings Past Medical History: No date: Arthritis No date: Back pain No date: HOH (hard of hearing) No date: Hyperlipidemia No date: Hypertension No date: Kidney stone No date: PONV (postoperative nausea and vomiting) No date: Seasonal allergies No date: Wears glasses  Past Surgical History: 1999: ABDOMINAL HYSTERECTOMY 05/19/2013: BREAST REDUCTION SURGERY; Bilateral     Comment:  Procedure: BILATERAL BREAST REDUCTION  ;  Surgeon:               Louisa SecondGerald Truesdale, MD;  Location:  SURGERY               CENTER;  Service: Plastics;  Laterality: Bilateral; No date: CESAREAN SECTION 1998: CHOLECYSTECTOMY     Comment:  lap choli No date: CYSTOSCOPY/RETROGRADE/URETEROSCOPY/STONE EXTRACTION WITH  BASKET No date: DILATION AND CURETTAGE OF UTERUS 11/15/2017: EXTRACORPOREAL SHOCK WAVE LITHOTRIPSY; Left     Comment:  Procedure: EXTRACORPOREAL SHOCK WAVE LITHOTRIPSY (ESWL);              Surgeon: Vanna ScotlandBrandon, Ashley, MD;  Location: [facility];                Service: Urology;  Laterality: Left; 1997: HAMMER TOE SURGERY     Comment:  right foot No  date: LITHOTRIPSY  BMI    Body Mass Index:  34.34 kg/m      Reproductive/Obstetrics negative OB ROS                             Anesthesia Physical Anesthesia Plan  ASA: III  Anesthesia Plan: General ETT   Post-op Pain Management: GA combined w/ Regional for post-op pain   Induction: Intravenous  PONV Risk Score and Plan: Ondansetron, Dexamethasone, Midazolam and Treatment may vary due to age or medical condition  Airway Management Planned: Oral ETT  Additional Equipment:   Intra-op Plan:   Post-operative Plan: Extubation in OR  Informed Consent: I have reviewed the patients History and Physical, chart, labs and discussed the procedure including the risks, benefits and alternatives for the proposed anesthesia with the patient or authorized representative who has indicated his/her understanding and acceptance.   Dental Advisory Given  Plan Discussed with: Anesthesiologist, CRNA and Surgeon  Anesthesia Plan Comments: (Patient consented for risks of anesthesia including but not limited to:  - adverse reactions to medications - damage to teeth, lips or other oral mucosa - sore throat or hoarseness - Damage to heart, brain, lungs or loss of life  Patient voiced understanding.)        Anesthesia Quick Evaluation

## 2018-11-05 NOTE — Transfer of Care (Signed)
Immediate Anesthesia Transfer of Care Note  Patient: Angel NearingVicki R Goldammer  Procedure(s) Performed: OPEN REDUCTION INTERNAL FIXATION (ORIF) ANKLE FRACTURE (Right Ankle)  Patient Location: PACU  Anesthesia Type:General  Level of Consciousness: awake, alert , oriented and patient cooperative  Airway & Oxygen Therapy: Patient Spontanous Breathing and Patient connected to nasal cannula oxygen  Post-op Assessment: Report given to RN and Post -op Vital signs reviewed and stable  Post vital signs: Reviewed and stable  Last Vitals:  Vitals Value Taken Time  BP    Temp    Pulse    Resp    SpO2      Last Pain:  Vitals:   11/05/18 1235  TempSrc:   PainSc: 0-No pain      Patients Stated Pain Goal: 2 (11/05/18 1135)  Complications: No apparent anesthesia complications

## 2018-11-05 NOTE — Anesthesia Procedure Notes (Signed)
Procedure Name: Intubation Date/Time: 11/05/2018 1:09 PM Performed by: Bernardo Heater, CRNA Pre-anesthesia Checklist: Patient identified, Emergency Drugs available, Suction available and Patient being monitored Patient Re-evaluated:Patient Re-evaluated prior to induction Oxygen Delivery Method: Circle system utilized Preoxygenation: Pre-oxygenation with 100% oxygen Induction Type: IV induction Laryngoscope Size: Mac and 3 Grade View: Grade II Tube size: 7.0 mm Number of attempts: 1 Airway Equipment and Method: Bougie stylet Placement Confirmation: positive ETCO2 and breath sounds checked- equal and bilateral Secured at: 21 cm Tube secured with: Tape Dental Injury: Teeth and Oropharynx as per pre-operative assessment  Difficulty Due To: Difficulty was unanticipated and Difficult Airway- due to anterior larynx Future Recommendations: Recommend- induction with short-acting agent, and alternative techniques readily available

## 2018-11-05 NOTE — Op Note (Signed)
11/05/2018  2:53 PM  Patient:   Angel NearingVicki R Gautier  Pre-Op Diagnosis:   Unstable distal fibular fracture, right ankle.  Post-Op Diagnosis:   Same.  Procedure:   Open reduction and internal fixation of unstable distal fibular fracture, right ankle.  Surgeon:   Maryagnes AmosJ. Jeffrey Kolten Ryback, MD  Assistant:   Joni FearsJohanna Young, PA-S  Anesthesia:   GET with popliteal block placed preoperatively by anesthesiologist  Findings:   As above.  Complications:   None  EBL:   5 cc  Fluids:   850 cc crystalloid  UOP:   None  TT:   67 min at 250 mmHg  Drains:   None  Closure:   Staples  Implants:   Biomet ALPS 7-hole composite locking plate and screws  Brief Clinical Note:   The patient is a 67 year old female who sustained the above-noted injury about 10 days ago. Initially, the patient did not seek any treatment. She saw her primary care provider a week ago who obtained x-rays which demonstrated the above-noted fracture. The patient presents at this time for definitive management of her injury.  Procedure:   The patient was brought into the operating room and lain in the supine position. The right foot and lower leg were prepped with ChloraPrep solution, then draped sterilely. Preoperative antibiotics were administered. A timeout was performed to verify the appropriate surgical site before the limb was exsanguinated with an Esmarch and the calf tourniquet inflated to 250 mmHg. Laterally, an 8-10 cm incision was made over the lateral aspect of the distal fibula. The incision was carried down through the subcutaneous tissues to expose the fracture site. The fracture hematoma was debrided before the fracture was reduced and temporarily secured using a bone clamp. A lag screw was placed in an anterior to posterior direction perpendicular to the fracture. A 7-hole Biomet composite locking plate was selected and contoured to match the lateral cortex before it was applied over the lateral aspect of the distal  fibula. After verifying its position fluoroscopically, it was secured using a K wire. Three 3.5 mm nonlocking cortical screws were placed proximal to the fracture and three locking screws were placed distally. The adequacy of fracture reduction and hardware position was verified fluoroscopically in AP and lateral projections and found to be excellent.  The wound was copiously irrigated with sterile saline solution. The subcutaneous tissues were closed in two layers using 2-0 Vicryl interrupted sutures before the skin was closed using staples. A sterile bulky dressing was applied to the wound before the patient was placed into a cam walker boot which she had brought with her. The patient was then awakened, extubated, and returned to the recovery room in satisfactory condition after tolerating the procedure well.

## 2018-11-06 ENCOUNTER — Encounter: Payer: Self-pay | Admitting: Surgery

## 2018-11-07 NOTE — Anesthesia Postprocedure Evaluation (Signed)
Anesthesia Post Note  Patient: Kimberlee NearingVicki R Welge  Procedure(s) Performed: OPEN REDUCTION INTERNAL FIXATION (ORIF) ANKLE FRACTURE (Right Ankle)  Patient location during evaluation: PACU Anesthesia Type: General Level of consciousness: awake and alert Pain management: pain level controlled Vital Signs Assessment: post-procedure vital signs reviewed and stable Respiratory status: spontaneous breathing, nonlabored ventilation, respiratory function stable and patient connected to nasal cannula oxygen Cardiovascular status: blood pressure returned to baseline and stable Postop Assessment: no apparent nausea or vomiting Anesthetic complications: no     Last Vitals:  Vitals:   11/05/18 1624 11/05/18 1732  BP: (!) 183/78 (!) 176/91  Pulse: 71 74  Resp: 16 16  Temp:    SpO2: 96% 96%    Last Pain:  Vitals:   11/06/18 0950  TempSrc:   PainSc: 0-No pain                 Yevette EdwardsJames G Adams

## 2018-11-19 DIAGNOSIS — E669 Obesity, unspecified: Secondary | ICD-10-CM | POA: Insufficient documentation

## 2019-05-22 ENCOUNTER — Telehealth: Payer: Self-pay | Admitting: Urology

## 2019-05-22 NOTE — Telephone Encounter (Signed)
Pt states she thought she had UTI, when to her PCP over 2 weeks ago, UTI negative, Pt complains of urinary frequency, denies any other UTI symptoms. Pt also self treating yeast infection. Pt main complaint is that she is urinating every 25-30 mins at night, getting no sleep. Pt states Dr. Lonna Cobb told her at last visit to lets see how she does, if nothing changes he could give her a Rx. Pt states things were okay until recently. Please advise pt at [phone].

## 2019-05-23 MED ORDER — OXYBUTYNIN CHLORIDE 5 MG PO TABS: 5.0000 mg | ORAL_TABLET | Freq: Every day | ORAL | 1 refills | Status: DC

## 2019-05-23 NOTE — Telephone Encounter (Signed)
mychart sent.

## 2019-05-23 NOTE — Telephone Encounter (Signed)
rx oxybutinin 5mg  qhs sent to pharm.  sched f/u telephone or office visit 4-6 weeks

## 2019-05-26 NOTE — Telephone Encounter (Signed)
Pt. Called 05/26/2019 to report that the medication prescribed did not effect her symptoms "at all". Patient is requesting a different mode of treatment, either increased dosage or different medication.

## 2019-05-28 ENCOUNTER — Other Ambulatory Visit: Payer: Self-pay

## 2019-06-01 IMAGING — CR DG ABDOMEN 1V
1 series · 1 of 1 positions shown · non-contrast
Comparison: 11/15/2017.  CT 11/15/2017.

CLINICAL DATA: Left ureteral stone.

EXAM:
ABDOMEN - 1 VIEW

[dg abd 1 view]
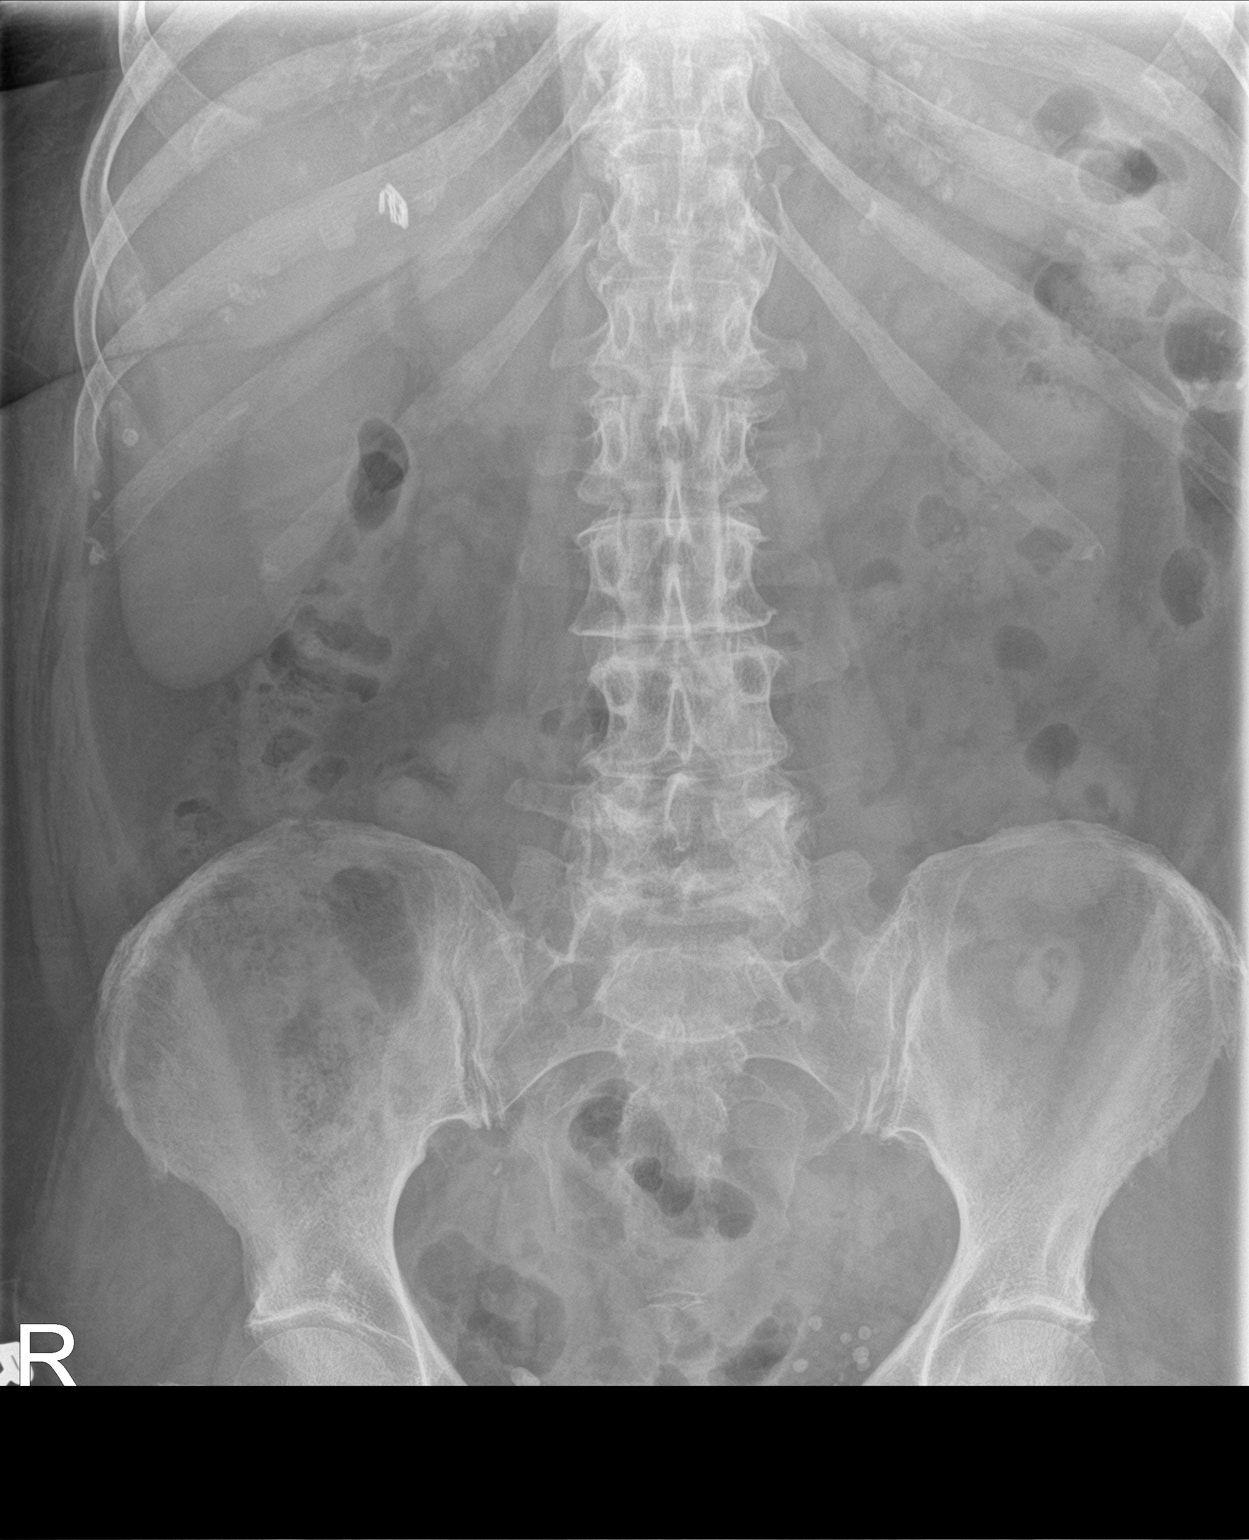

[1 of 1 positions shown; findings below may reference images not displayed]

FINDINGS: Surgical clips right upper quadrant. Punctate calcifications in the
left kidney suggesting left nephrolithiasis previously identified
left ureteral stone in unchanged position and again overlying the
left sacrum. Pelvic calcifications consistent phleboliths. Ureteral.
IMPRESSION: Previously identified 9 mm left ureteral stone is in unchanged
position overlying the left sacrum.

## 2019-06-12 ENCOUNTER — Telehealth: Payer: Self-pay | Admitting: Urology

## 2019-06-12 NOTE — Telephone Encounter (Signed)
Pt called and wants to know if it is ok to take AZO with her Oxybutnin. Please advise.

## 2019-06-13 NOTE — Telephone Encounter (Signed)
Returned call to patient and advised ok. Patient verbalized understanding.

## 2019-06-25 ENCOUNTER — Other Ambulatory Visit: Payer: Self-pay | Admitting: Internal Medicine

## 2019-06-25 DIAGNOSIS — Z1231 Encounter for screening mammogram for malignant neoplasm of breast: Secondary | ICD-10-CM

## 2019-07-07 ENCOUNTER — Ambulatory Visit
Admission: RE | Admit: 2019-07-07 | Discharge: 2019-07-07 | Disposition: A | Discharge status: Home / Self Care | Payer: Medicare Other | Source: Ambulatory Visit | Attending: Internal Medicine | Admitting: Internal Medicine

## 2019-07-07 ENCOUNTER — Encounter (INDEPENDENT_AMBULATORY_CARE_PROVIDER_SITE_OTHER): Payer: Self-pay

## 2019-07-07 ENCOUNTER — Other Ambulatory Visit: Payer: Self-pay

## 2019-07-07 DIAGNOSIS — Z1231 Encounter for screening mammogram for malignant neoplasm of breast: Secondary | ICD-10-CM | POA: Diagnosis present

## 2019-07-14 ENCOUNTER — Other Ambulatory Visit: Payer: Self-pay | Admitting: Family Medicine

## 2019-07-14 MED ORDER — OXYBUTYNIN CHLORIDE 5 MG PO TABS: 5.0000 mg | ORAL_TABLET | Freq: Every day | ORAL | 3 refills | Status: DC

## 2019-08-21 ENCOUNTER — Telehealth: Payer: Self-pay | Admitting: Urology

## 2019-08-21 DIAGNOSIS — R351 Nocturia: Secondary | ICD-10-CM

## 2019-08-21 NOTE — Telephone Encounter (Signed)
Pt called and has been taking Oxybutynin for about 10-12 wks.  She said it is helping, but she is still getting up about 6-8 times per night.  She wants to know if she is going to have to take this forever?  If so, she will need a refill.

## 2019-08-26 MED ORDER — OXYBUTYNIN CHLORIDE 5 MG PO TABS: 5.0000 mg | ORAL_TABLET | Freq: Every day | ORAL | 3 refills | Status: DC

## 2019-08-26 NOTE — Telephone Encounter (Signed)
Called pt, line was picked up and them immediately hung up. 1st attempt

## 2019-08-26 NOTE — Telephone Encounter (Signed)
Called pt she questions if she needs to be on oxybutynin indefinitely. Advised pt that she should continue medication as long as it is therapeutic for her. She states that she has seen great improvement since being on the medication. Perviously she was voiding every 5mins and night, this has improved to every 2.5 hours. Refill sent. Pt to f/u as scheduled in October.

## 2019-08-26 NOTE — Telephone Encounter (Signed)
Pt called office and she didn't answer because it was from an unknown number, but says she will answer the next time someone calls.

## 2019-09-19 ENCOUNTER — Ambulatory Visit
Admission: RE | Admit: 2019-09-19 | Discharge: 2019-09-19 | Disposition: A | Discharge status: Home / Self Care | Payer: Medicare Other | Source: Ambulatory Visit | Attending: Urology | Admitting: Urology

## 2019-09-19 DIAGNOSIS — Z87442 Personal history of urinary calculi: Secondary | ICD-10-CM | POA: Insufficient documentation

## 2019-09-23 ENCOUNTER — Ambulatory Visit: Payer: Medicare Other | Admitting: Urology

## 2019-09-24 ENCOUNTER — Ambulatory Visit: Payer: Medicare Other | Admitting: Urology

## 2019-10-01 ENCOUNTER — Encounter: Payer: Self-pay | Admitting: Urology

## 2019-10-01 ENCOUNTER — Ambulatory Visit: Payer: Medicare Other | Admitting: Urology

## 2019-10-01 ENCOUNTER — Other Ambulatory Visit: Payer: Self-pay

## 2019-10-01 VITALS — BP 148/91 | HR 62 | Ht 59.0 in | Wt 181.0 lb

## 2019-10-01 DIAGNOSIS — N200 Calculus of kidney: Secondary | ICD-10-CM

## 2019-10-01 DIAGNOSIS — R351 Nocturia: Secondary | ICD-10-CM | POA: Diagnosis not present

## 2019-10-01 NOTE — Progress Notes (Signed)
10/01/2019 11:24 AM   Angel Harrison 08-19-1951 [id number]  Referring provider: Baxter Hire, MD [place],  [place] [address]  Chief Complaint  Patient presents with  . Urinary Frequency    HPI: 68 y.o. female presents for annual follow-up.  She is status post shockwave lithotripsy of a 9 mm ureteral calculus November 2018.  CT showed punctate right renal calculi.  Since her visit last year she denies flank or abdominal pain.  No dysuria or gross hematuria.  KUB performed today was reviewed and there are faint calcifications over the left renal outline.  She contacted the office in June 2020 complaining of nocturia q. 15 to 20 minutes.  This was significantly interrupting her sleep pattern.  Urinalysis showed no evidence of infection.  She was given a trial of immediate release oxybutynin at bedtime and states this has significantly improved her voiding pattern.  She currently gets up every 2-3 hours which does not interrupt her sleep pattern.   PMH: Past Medical History:  Diagnosis Date  . Arthritis   . Back pain   . HOH (hard of hearing)   . Hyperlipidemia   . Hypertension   . Kidney stone   . PONV (postoperative nausea and vomiting)   . Seasonal allergies   . Wears glasses     Surgical History: Past Surgical History:  Procedure Laterality Date  . ABDOMINAL HYSTERECTOMY  1999  . BREAST REDUCTION SURGERY Bilateral 05/19/2013   Procedure: BILATERAL BREAST REDUCTION  ;  Surgeon: Cristine Polio, MD;  Location: [facility];  Service: Plastics;  Laterality: Bilateral;  . CESAREAN SECTION    . CHOLECYSTECTOMY  1998   lap choli  . CYSTOSCOPY/RETROGRADE/URETEROSCOPY/STONE EXTRACTION WITH BASKET    . DILATION AND CURETTAGE OF UTERUS    . EXTRACORPOREAL SHOCK WAVE LITHOTRIPSY Left 11/15/2017   Procedure: EXTRACORPOREAL SHOCK WAVE LITHOTRIPSY (ESWL);  Surgeon: Hollice Espy, MD;  Location: [facility];  Service: Urology;  Laterality:  Left;  . [place]   right foot  . LITHOTRIPSY    . ORIF ANKLE FRACTURE Right 11/05/2018   Procedure: OPEN REDUCTION INTERNAL FIXATION (ORIF) ANKLE FRACTURE;  Surgeon: Corky Mull, MD;  Location: [facility];  Service: Orthopedics;  Laterality: Right;  . REDUCTION MAMMAPLASTY Bilateral 05/2012    Home Medications:  Allergies as of 10/01/2019      Reactions   Darvon [propoxyphene Hcl]    Dizzy-crazy      Medication List       Accurate as of October 01, 2019 11:24 AM. If you have any questions, ask your nurse or doctor.        STOP taking these medications   oxyCODONE 5 MG immediate release tablet Commonly known as: Roxicodone Stopped by: Abbie Sons, MD     TAKE these medications   aspirin 325 MG tablet Take by mouth.   bisoprolol-hydrochlorothiazide 10-6.25 MG tablet Commonly known as: ZIAC Take 1 tablet by mouth daily.   lisinopril 40 MG tablet Commonly known as: ZESTRIL Take 40 mg by mouth daily.   loratadine 10 MG tablet Commonly known as: CLARITIN Take 10 mg by mouth as needed for allergies.   oxybutynin 5 MG tablet Commonly known as: DITROPAN Take 1 tablet (5 mg total) by mouth at bedtime.   simvastatin 40 MG tablet Commonly known as: ZOCOR Take 40 mg by mouth every evening.       Allergies:  Allergies  Allergen Reactions  . Darvon [  Propoxyphene Hcl]     Dizzy-crazy    Family History: Family History  Problem Relation Age of Onset  . CAD Father   . Breast cancer Maternal Grandmother     Social History:  reports that she has never smoked. She has never used smokeless tobacco. She reports that she does not drink alcohol or use drugs.  ROS: UROLOGY Frequent Urination?: Yes Hard to postpone urination?: No Burning/pain with urination?: No Get up at night to urinate?: Yes Leakage of urine?: No Urine stream starts and stops?: No Trouble starting stream?: No Do you have to strain to urinate?: No Blood in urine?: No  Urinary tract infection?: No Sexually transmitted disease?: No Injury to kidneys or bladder?: No Painful intercourse?: No Weak stream?: No Currently pregnant?: No Vaginal bleeding?: No Last menstrual period?: n  Gastrointestinal Nausea?: No Vomiting?: No Indigestion/heartburn?: No Diarrhea?: No Constipation?: No  Constitutional Fever: No Night sweats?: No Weight loss?: No Fatigue?: No  Skin Skin rash/lesions?: No Itching?: No  Eyes Blurred vision?: No Double vision?: No  Ears/Nose/Throat Sore throat?: No Sinus problems?: No  Hematologic/Lymphatic Swollen glands?: No Easy bruising?: No  Cardiovascular Leg swelling?: No Chest pain?: No  Respiratory Cough?: No Shortness of breath?: No  Endocrine Excessive thirst?: No  Musculoskeletal Back pain?: No Joint pain?: No  Neurological Headaches?: No Dizziness?: No  Psychologic Depression?: No Anxiety?: No  Physical Exam: BP (!) 148/91 (BP Location: Left Arm, Patient Position: Sitting, Cuff Size: Normal)   Pulse 62   Ht 4\' 11"  (1.499 m)   Wt 181 lb (82.1 kg)   BMI 36.56 kg/m   Constitutional:  Alert and oriented, No acute distress. HEENT: [place] AT, moist mucus membranes.  Trachea midline, no masses. Cardiovascular: No clubbing, cyanosis, or edema. Respiratory: Normal respiratory effort, no increased work of breathing. GI: Abdomen is soft, nontender, nondistended, no abdominal masses GU: No CVA tenderness Lymph: No cervical or inguinal lymphadenopathy. Skin: No rashes, bruises or suspicious lesions. Neurologic: Grossly intact, no focal deficits, moving all 4 extremities. Psychiatric: Normal mood and affect.   Pertinent Imaging: Images were reviewed Results for orders placed during the hospital encounter of 09/19/19  Abdomen 1 view (KUB)   Narrative CLINICAL DATA:  History of kidney stones  EXAM: ABDOMEN - 1 VIEW  COMPARISON:  09/13/2018  FINDINGS: There are small calcifications that  project over the lower pole the left kidney measuring up to approximately 3 mm. There are calcifications that project over the upper pole the left kidney there are favored to represent vascular calcifications as opposed to nephroliths. There are multiple calcifications that project over the patient's pelvis. These are relatively stable from prior study and are favored to represent phleboliths.  IMPRESSION: Possible left-sided nephrolithiasis. Again noted are multiple nephroliths within the patient's pelvis.   Electronically Signed   By: 09/15/2018 M.D.   On: 09/19/2019 15:30     Assessment & Plan:    - Nephrolithiasis Faint calcifications overlying the left renal outline of uncertain significance and she did not have left-sided calculi on prior CT.  We will continue annual KUB.  - Nocturia Doing well on a bedtime dose of immediate release oxybutynin.  Refill Rx sent.  Continue annual follow-up   11/19/2019, MD  [facility] [address], [address] [place], [place] [phone]

## 2019-10-02 ENCOUNTER — Encounter: Payer: Self-pay | Admitting: Urology

## 2019-10-02 DIAGNOSIS — R351 Nocturia: Secondary | ICD-10-CM | POA: Insufficient documentation

## 2020-01-19 ENCOUNTER — Other Ambulatory Visit: Payer: Self-pay | Admitting: Urology

## 2020-01-19 DIAGNOSIS — R351 Nocturia: Secondary | ICD-10-CM

## 2020-05-28 ENCOUNTER — Other Ambulatory Visit: Payer: Self-pay | Admitting: Urology

## 2020-05-28 DIAGNOSIS — R351 Nocturia: Secondary | ICD-10-CM

## 2020-06-09 ENCOUNTER — Other Ambulatory Visit: Payer: Self-pay | Admitting: Internal Medicine

## 2020-06-09 DIAGNOSIS — Z1231 Encounter for screening mammogram for malignant neoplasm of breast: Secondary | ICD-10-CM

## 2020-07-07 ENCOUNTER — Ambulatory Visit
Admission: RE | Admit: 2020-07-07 | Discharge: 2020-07-07 | Disposition: A | Discharge status: Home / Self Care | Payer: Medicare PPO | Source: Ambulatory Visit | Attending: Internal Medicine | Admitting: Internal Medicine

## 2020-07-07 ENCOUNTER — Other Ambulatory Visit: Payer: Self-pay

## 2020-07-07 DIAGNOSIS — Z1231 Encounter for screening mammogram for malignant neoplasm of breast: Secondary | ICD-10-CM | POA: Insufficient documentation

## 2020-09-07 ENCOUNTER — Other Ambulatory Visit: Payer: Self-pay | Admitting: *Deleted

## 2020-09-07 DIAGNOSIS — N200 Calculus of kidney: Secondary | ICD-10-CM

## 2020-09-27 ENCOUNTER — Other Ambulatory Visit: Payer: Self-pay | Admitting: Urology

## 2020-09-27 DIAGNOSIS — R351 Nocturia: Secondary | ICD-10-CM

## 2020-09-29 ENCOUNTER — Ambulatory Visit
Admission: RE | Admit: 2020-09-29 | Discharge: 2020-09-29 | Disposition: A | Discharge status: Home / Self Care | Payer: Medicare PPO | Source: Ambulatory Visit | Attending: Urology | Admitting: Urology

## 2020-09-29 DIAGNOSIS — N200 Calculus of kidney: Secondary | ICD-10-CM

## 2020-09-29 NOTE — Progress Notes (Signed)
09/30/2020 11:24 AM   Angel Harrison [birth year] [id number]  Referring provider: Gracelyn Nurse, MD [address] [place],  [place] [address] Chief Complaint  Patient presents with  . Nephrolithiasis    HPI: Angel Harrison is a 69 y.o. female who presents today for an annual follow up of nephrolithiasis and nocturia.   -Patient is s/p shockwave lithotripsy of a 9 mm ureteral calculus November 2018.   -CT showed punctate right renal calculi. -KUB from 09/2019 noted there are faint calcifications over the left renal outline. -Nocturia significant improved on immediate release oxybutynin at bedtime -No bothersome daytime symptoms -Denies dysuria or gross hematuria -Denies flank, abdominal or pelvic pain   PMH: Past Medical History:  Diagnosis Date  . Arthritis   . Back pain   . HOH (hard of hearing)   . Hyperlipidemia   . Hypertension   . Kidney stone   . PONV (postoperative nausea and vomiting)   . Seasonal allergies   . Wears glasses     Surgical History: Past Surgical History:  Procedure Laterality Date  . ABDOMINAL HYSTERECTOMY  1999  . BREAST REDUCTION SURGERY Bilateral 05/19/2013   Procedure: BILATERAL BREAST REDUCTION  ;  Surgeon: Louisa Second, MD;  Location: [facility];  Service: Plastics;  Laterality: Bilateral;  . CESAREAN SECTION    . CHOLECYSTECTOMY  1998   lap choli  . CYSTOSCOPY/RETROGRADE/URETEROSCOPY/STONE EXTRACTION WITH BASKET    . DILATION AND CURETTAGE OF UTERUS    . EXTRACORPOREAL SHOCK WAVE LITHOTRIPSY Left 11/15/2017   Procedure: EXTRACORPOREAL SHOCK WAVE LITHOTRIPSY (ESWL);  Surgeon: Vanna Scotland, MD;  Location: [facility];  Service: Urology;  Laterality: Left;  . HAMMER TOE SURGERY  1997   right foot  . LITHOTRIPSY    . ORIF ANKLE FRACTURE Right 11/05/2018   Procedure: OPEN REDUCTION INTERNAL FIXATION (ORIF) ANKLE FRACTURE;  Surgeon: Christena Flake, MD;  Location: [facility];  Service: Orthopedics;   Laterality: Right;  . REDUCTION MAMMAPLASTY Bilateral 05/2012    Home Medications:  Allergies as of 09/30/2020      Reactions   Darvon [propoxyphene Hcl]    Dizzy-crazy      Medication List       Accurate as of September 30, 2020 11:24 AM. If you have any questions, ask your nurse or doctor.        aspirin 325 MG tablet Take by mouth.   bisoprolol-hydrochlorothiazide 10-6.25 MG tablet Commonly known as: ZIAC Take 1 tablet by mouth daily.   lisinopril 40 MG tablet Commonly known as: ZESTRIL Take 40 mg by mouth daily.   loratadine 10 MG tablet Commonly known as: CLARITIN Take 10 mg by mouth as needed for allergies.   oxybutynin 5 MG tablet Commonly known as: DITROPAN TAKE 1 TABLET BY MOUTH EVERYDAY AT BEDTIME   simvastatin 40 MG tablet Commonly known as: ZOCOR Take 40 mg by mouth every evening.       Allergies:  Allergies  Allergen Reactions  . Darvon [Propoxyphene Hcl]     Dizzy-crazy    Family History: Family History  Problem Relation Age of Onset  . CAD Father   . Breast cancer Maternal Grandmother     Social History:  reports that she has never smoked. She has never used smokeless tobacco. She reports that she does not drink alcohol and does not use drugs.   Physical Exam: BP (!) 160/77   Pulse 64   Ht 4\' 11"  (1.499 m)   Wt 170  lb (77.1 kg)   BMI 34.34 kg/m   Constitutional:  Alert and oriented, No acute distress. HEENT: [place] AT, moist mucus membranes.  Trachea midline, no masses. Cardiovascular: No clubbing, cyanosis, or edema. Respiratory: Normal respiratory effort, no increased work of breathing. Skin: No rashes, bruises or suspicious lesions. Neurologic: Grossly intact, no focal deficits, moving all 4 extremities. Psychiatric: Normal mood and affect.  Laboratory Data:  Pertinent Imaging: KUB personally reviewed   Assessment & Plan:    1. Nephrolithiasis Asymptomatic  KUB today shows stable left renal calcification  2. Nocturia    Stable on the release oxybutynin at bedtime Refill sent.    [facility] [address], [address] [place], [place] [address] [phone]  I, Theador Hawthorne, am acting as a scribe for Dr. Lorin Picket C. Starsha Morning,  I have reviewed the above documentation for accuracy and completeness, and I agree with the above.   [name], MD

## 2020-09-30 ENCOUNTER — Encounter: Payer: Self-pay | Admitting: Urology

## 2020-09-30 ENCOUNTER — Other Ambulatory Visit: Payer: Self-pay

## 2020-09-30 ENCOUNTER — Ambulatory Visit: Payer: Medicare PPO | Admitting: Urology

## 2020-09-30 VITALS — BP 160/77 | HR 64 | Ht 59.0 in | Wt 170.0 lb

## 2020-09-30 DIAGNOSIS — R351 Nocturia: Secondary | ICD-10-CM | POA: Diagnosis not present

## 2020-09-30 DIAGNOSIS — N200 Calculus of kidney: Secondary | ICD-10-CM

## 2020-10-03 ENCOUNTER — Encounter: Payer: Self-pay | Admitting: Urology

## 2020-10-03 MED ORDER — OXYBUTYNIN CHLORIDE 5 MG PO TABS: ORAL_TABLET | ORAL | 3 refills | Status: DC

## 2021-01-15 ENCOUNTER — Other Ambulatory Visit: Payer: Self-pay | Admitting: Urology

## 2021-01-15 DIAGNOSIS — R351 Nocturia: Secondary | ICD-10-CM

## 2021-04-15 ENCOUNTER — Other Ambulatory Visit: Payer: Self-pay | Admitting: Urology

## 2021-04-15 DIAGNOSIS — R351 Nocturia: Secondary | ICD-10-CM

## 2021-07-26 ENCOUNTER — Other Ambulatory Visit: Payer: Self-pay | Admitting: Internal Medicine

## 2021-07-26 DIAGNOSIS — Z1231 Encounter for screening mammogram for malignant neoplasm of breast: Secondary | ICD-10-CM

## 2021-08-10 ENCOUNTER — Ambulatory Visit: Payer: Medicare PPO

## 2021-09-29 ENCOUNTER — Ambulatory Visit
Admission: RE | Admit: 2021-09-29 | Discharge: 2021-09-29 | Disposition: A | Discharge status: Home / Self Care | Payer: Medicare PPO | Attending: Urology | Admitting: Urology

## 2021-09-29 ENCOUNTER — Ambulatory Visit
Admission: RE | Admit: 2021-09-29 | Discharge: 2021-09-29 | Disposition: A | Discharge status: Home / Self Care | Payer: Medicare PPO | Source: Ambulatory Visit | Attending: Urology | Admitting: Urology

## 2021-09-29 DIAGNOSIS — N200 Calculus of kidney: Secondary | ICD-10-CM

## 2021-09-30 ENCOUNTER — Other Ambulatory Visit: Payer: Self-pay

## 2021-09-30 ENCOUNTER — Ambulatory Visit: Payer: Medicare PPO | Admitting: Urology

## 2021-09-30 ENCOUNTER — Encounter: Payer: Self-pay | Admitting: Urology

## 2021-09-30 VITALS — BP 134/76 | HR 67 | Ht 59.0 in | Wt 174.0 lb

## 2021-09-30 DIAGNOSIS — N200 Calculus of kidney: Secondary | ICD-10-CM | POA: Diagnosis not present

## 2021-09-30 DIAGNOSIS — R351 Nocturia: Secondary | ICD-10-CM

## 2021-09-30 NOTE — Progress Notes (Signed)
09/30/2021 11:39 AM   Angel Harrison May 22, 1951 [id number]  Referring provider: Gracelyn Nurse, MD [address] [place],  [place] [address]  Chief Complaint  Patient presents with   Nephrolithiasis    Urologic history:  1.  Nephrolithiasis SWL 9 mm ureteral calculus November 2018 Renal calculi  2.  Nocturia Oxybutynin IR 5 mg nightly  HPI: Angel Harrison presents for annual follow-up.  Since last years visit denies flank, abdominal or pelvic pain Significant improvement in her nocturia on oxybutynin at bedtime She only gets up once per night to void Denies dysuria, gross hematuria  PMH: Past Medical History:  Diagnosis Date   Arthritis    Back pain    HOH (hard of hearing)    Hyperlipidemia    Hypertension    Kidney stone    PONV (postoperative nausea and vomiting)    Seasonal allergies    Wears glasses     Surgical History: Past Surgical History:  Procedure Laterality Date   ABDOMINAL HYSTERECTOMY  1999   BREAST REDUCTION SURGERY Bilateral 05/19/2013   Procedure: BILATERAL BREAST REDUCTION  ;  Surgeon: Louisa Second, MD;  Location: [facility];  Service: Plastics;  Laterality: Bilateral;   CESAREAN SECTION     CHOLECYSTECTOMY  1998   lap choli   CYSTOSCOPY/RETROGRADE/URETEROSCOPY/STONE EXTRACTION WITH BASKET     DILATION AND CURETTAGE OF UTERUS     EXTRACORPOREAL SHOCK WAVE LITHOTRIPSY Left 11/15/2017   Procedure: EXTRACORPOREAL SHOCK WAVE LITHOTRIPSY (ESWL);  Surgeon: Vanna Scotland, MD;  Location: [facility];  Service: Urology;  Laterality: Left;   HAMMER TOE SURGERY  1997   right foot   LITHOTRIPSY     ORIF ANKLE FRACTURE Right 11/05/2018   Procedure: OPEN REDUCTION INTERNAL FIXATION (ORIF) ANKLE FRACTURE;  Surgeon: Christena Flake, MD;  Location: [facility];  Service: Orthopedics;  Laterality: Right;   REDUCTION MAMMAPLASTY Bilateral 05/2012    Home Medications:  Allergies as of 09/30/2021       Reactions   Darvon  [propoxyphene Hcl]    Dizzy-crazy        Medication List        Accurate as of September 30, 2021 11:39 AM. If you have any questions, ask your nurse or doctor.          aspirin 325 MG tablet Take by mouth.   bisoprolol-hydrochlorothiazide 10-6.25 MG tablet Commonly known as: ZIAC Take 1 tablet by mouth daily.   lisinopril 40 MG tablet Commonly known as: ZESTRIL Take 40 mg by mouth daily.   loratadine 10 MG tablet Commonly known as: CLARITIN Take 10 mg by mouth as needed for allergies.   oxybutynin 5 MG tablet Commonly known as: DITROPAN TAKE 1 TABLET BY MOUTH EVERYDAY AT BEDTIME   simvastatin 40 MG tablet Commonly known as: ZOCOR Take 40 mg by mouth every evening.        Allergies:  Allergies  Allergen Reactions   Darvon [Propoxyphene Hcl]     Dizzy-crazy    Family History: Family History  Problem Relation Age of Onset   CAD Father    Breast cancer Maternal Grandmother     Social History:  reports that she has never smoked. She has never used smokeless tobacco. She reports that she does not drink alcohol and does not use drugs.   Physical Exam: There were no vitals taken for this visit.  Constitutional:  Alert and oriented, No acute distress. HEENT: [place] AT, moist mucus membranes.  Trachea midline,  no masses. Cardiovascular: No clubbing, cyanosis, or edema. Respiratory: Normal respiratory effort, no increased work of breathing. Psychiatric: Normal mood and affect.   Pertinent Imaging: KUB images personally reviewed and interpreted   Assessment & Plan:    1.  Nephrolithiasis KUB today reviewed and small calcifications overlying renal outlines bilaterally Continue annual KUB  2.  Nocturia Doing well on IR oxybutynin at bedtime; Rx refill   Angel Altes, MD  [facility] [address] [place], [place] [address] [phone]

## 2022-02-26 LAB — EXTERNAL GENERIC LAB PROCEDURE: COLOGUARD: NEGATIVE

## 2022-02-26 LAB — COLOGUARD: COLOGUARD: NEGATIVE

## 2022-03-06 ENCOUNTER — Telehealth: Payer: Self-pay | Admitting: Urology

## 2022-03-06 ENCOUNTER — Other Ambulatory Visit: Payer: Self-pay | Admitting: *Deleted

## 2022-03-06 DIAGNOSIS — R351 Nocturia: Secondary | ICD-10-CM

## 2022-03-06 MED ORDER — OXYBUTYNIN CHLORIDE 5 MG PO TABS: 5.0000 mg | ORAL_TABLET | Freq: Every day | ORAL | 3 refills | Status: DC

## 2022-03-06 NOTE — Telephone Encounter (Signed)
Advised  patient medication sent  to [facility]  ?

## 2022-03-06 NOTE — Telephone Encounter (Signed)
Pt needs refill on Oxybutnin 5 mg sent to [facility] in [place].  She uses mail order but has been out since Friday. ?

## 2022-05-30 ENCOUNTER — Other Ambulatory Visit: Payer: Self-pay | Admitting: Urology

## 2022-05-30 DIAGNOSIS — R351 Nocturia: Secondary | ICD-10-CM

## 2022-10-09 ENCOUNTER — Other Ambulatory Visit: Payer: Self-pay | Admitting: *Deleted

## 2022-10-09 ENCOUNTER — Ambulatory Visit: Payer: Medicare PPO | Admitting: Urology

## 2022-10-09 DIAGNOSIS — N200 Calculus of kidney: Secondary | ICD-10-CM

## 2022-10-10 ENCOUNTER — Ambulatory Visit
Admission: RE | Admit: 2022-10-10 | Discharge: 2022-10-10 | Disposition: A | Discharge status: Home / Self Care | Payer: Medicare PPO | Attending: Urology | Admitting: Urology

## 2022-10-10 ENCOUNTER — Ambulatory Visit
Admission: RE | Admit: 2022-10-10 | Discharge: 2022-10-10 | Disposition: A | Discharge status: Home / Self Care | Payer: Medicare PPO | Source: Ambulatory Visit | Attending: Urology | Admitting: Urology

## 2022-10-10 DIAGNOSIS — N200 Calculus of kidney: Secondary | ICD-10-CM | POA: Insufficient documentation

## 2022-10-11 ENCOUNTER — Ambulatory Visit: Payer: Medicare PPO | Admitting: Urology

## 2022-10-11 ENCOUNTER — Encounter: Payer: Self-pay | Admitting: Urology

## 2022-10-11 DIAGNOSIS — R351 Nocturia: Secondary | ICD-10-CM | POA: Diagnosis not present

## 2022-10-11 MED ORDER — OXYBUTYNIN CHLORIDE 5 MG PO TABS: ORAL_TABLET | ORAL | 3 refills | Status: DC

## 2022-10-11 NOTE — Progress Notes (Signed)
10/11/2022 1:28 PM   Angel Harrison 1951-12-16 [id number]  Referring provider: Baxter Hire, MD [place],  [place] [address]  Chief Complaint  Patient presents with   Nephrolithiasis    Urologic history:  1.  Nephrolithiasis SWL 9 mm ureteral calculus November 2018 Renal calculi  2.  Nocturia Oxybutynin IR 5 mg nightly  HPI: 71 y.o. female presents for annual follow-up.  Doing well since last visit Significant decrease in her nocturia on oxybutynin IR at bedtime Denies dysuria, gross hematuria Denies flank, abdominal or pelvic pain   PMH: Past Medical History:  Diagnosis Date   Arthritis    Back pain    HOH (hard of hearing)    Hyperlipidemia    Hypertension    Kidney stone    PONV (postoperative nausea and vomiting)    Seasonal allergies    Wears glasses     Surgical History: Past Surgical History:  Procedure Laterality Date   ABDOMINAL HYSTERECTOMY  1999   BREAST REDUCTION SURGERY Bilateral 05/19/2013   Procedure: BILATERAL BREAST REDUCTION  ;  Surgeon: Cristine Polio, MD;  Location: [facility];  Service: Plastics;  Laterality: Bilateral;   CESAREAN SECTION     CHOLECYSTECTOMY  1998   lap choli   CYSTOSCOPY/RETROGRADE/URETEROSCOPY/STONE EXTRACTION WITH BASKET     DILATION AND CURETTAGE OF UTERUS     EXTRACORPOREAL SHOCK WAVE LITHOTRIPSY Left 11/15/2017   Procedure: EXTRACORPOREAL SHOCK WAVE LITHOTRIPSY (ESWL);  Surgeon: Hollice Espy, MD;  Location: [facility];  Service: Urology;  Laterality: Left;   HAMMER TOE SURGERY  1997   right foot   LITHOTRIPSY     ORIF ANKLE FRACTURE Right 11/05/2018   Procedure: OPEN REDUCTION INTERNAL FIXATION (ORIF) ANKLE FRACTURE;  Surgeon: Corky Mull, MD;  Location: [facility];  Service: Orthopedics;  Laterality: Right;   REDUCTION MAMMAPLASTY Bilateral 05/2012    Home Medications:  Allergies as of 10/11/2022       Reactions   Darvon [propoxyphene Hcl]    Dizzy-crazy         Medication List        Accurate as of October 11, 2022  1:28 PM. If you have any questions, ask your nurse or doctor.          aspirin 325 MG tablet Take by mouth.   bisoprolol-hydrochlorothiazide 10-6.25 MG tablet Commonly known as: ZIAC Take 1 tablet by mouth daily.   lisinopril 40 MG tablet Commonly known as: ZESTRIL Take 40 mg by mouth daily.   loratadine 10 MG tablet Commonly known as: CLARITIN Take 10 mg by mouth as needed for allergies.   oxybutynin 5 MG tablet Commonly known as: DITROPAN TAKE 1 TABLET BY MOUTH EVERYDAY AT BEDTIME   simvastatin 40 MG tablet Commonly known as: ZOCOR Take 40 mg by mouth every evening.        Allergies:  Allergies  Allergen Reactions   Darvon [Propoxyphene Hcl]     Dizzy-crazy    Family History: Family History  Problem Relation Age of Onset   CAD Father    Breast cancer Maternal Grandmother     Social History:  reports that she has never smoked. She has never used smokeless tobacco. She reports that she does not drink alcohol and does not use drugs.   Physical Exam: BP (!) 149/75   Pulse 60   Ht 4\' 11"  (1.499 m)   Wt 178 lb (80.7 kg)   BMI 35.95 kg/m   Constitutional:  Alert  and oriented, No acute distress. HEENT: [place] AT Respiratory: Normal respiratory effort, no increased work of breathing. Psychiatric: Normal mood and affect.   Pertinent Imaging: KUB images personally reviewed and interpreted.  Stable renal calculi   Assessment & Plan:    1.  Nephrolithiasis No significant interval change KUB Annual follow-up with KUB  2.  Nocturia Doing well on IR oxybutynin at bedtime; Rx refill   [name], MD  [facility] [address] [place], [place] [address] [phone]

## 2023-01-25 ENCOUNTER — Other Ambulatory Visit: Payer: Self-pay

## 2023-01-25 ENCOUNTER — Emergency Department: Payer: Medicare PPO

## 2023-01-25 ENCOUNTER — Emergency Department
Admission: EM | Admit: 2023-01-25 | Discharge: 2023-01-25 | Disposition: A | Discharge status: Home / Self Care | Payer: Medicare PPO | Attending: Emergency Medicine | Admitting: Emergency Medicine

## 2023-01-25 DIAGNOSIS — I10 Essential (primary) hypertension: Secondary | ICD-10-CM | POA: Insufficient documentation

## 2023-01-25 DIAGNOSIS — N132 Hydronephrosis with renal and ureteral calculous obstruction: Secondary | ICD-10-CM | POA: Insufficient documentation

## 2023-01-25 DIAGNOSIS — R109 Unspecified abdominal pain: Secondary | ICD-10-CM | POA: Diagnosis present

## 2023-01-25 DIAGNOSIS — N201 Calculus of ureter: Secondary | ICD-10-CM

## 2023-01-25 LAB — COMPREHENSIVE METABOLIC PANEL
ALT: 12 U/L (ref 0–44)
AST: 24 U/L (ref 15–41)
Albumin: 4 g/dL (ref 3.5–5.0)
Alkaline Phosphatase: 38 U/L (ref 38–126)
Anion gap: 10 (ref 5–15)
BUN: 11 mg/dL (ref 8–23)
CO2: 27 mmol/L (ref 22–32)
Calcium: 9.4 mg/dL (ref 8.9–10.3)
Chloride: 103 mmol/L (ref 98–111)
Creatinine, Ser: 0.91 mg/dL (ref 0.44–1.00)
GFR, Estimated: >60 mL/min (ref >60)
Glucose, Bld: 134 mg/dL — ABNORMAL HIGH (ref 70–99)
Potassium: 2.9 mmol/L — ABNORMAL LOW (ref 3.5–5.1)
Sodium: 140 mmol/L (ref 135–145)
Total Bilirubin: 1.1 mg/dL (ref 0.3–1.2)
Total Protein: 7.2 g/dL (ref 6.5–8.1)

## 2023-01-25 LAB — URINALYSIS, ROUTINE W REFLEX MICROSCOPIC
Bacteria, UA: NONE SEEN
Bilirubin Urine: NEGATIVE
Glucose, UA: NEGATIVE mg/dL
Ketones, ur: NEGATIVE mg/dL
Nitrite: NEGATIVE
Protein, ur: 100 mg/dL — AB
RBC / HPF: >50 RBC/hpf (ref 0–5)
Specific Gravity, Urine: 1.021 (ref 1.005–1.030)
Squamous Epithelial / HPF: >50 /HPF (ref 0–5)
pH: 5 (ref 5.0–8.0)

## 2023-01-25 LAB — CBC
HCT: 42.4 % (ref 36.0–46.0)
Hemoglobin: 13.8 g/dL (ref 12.0–15.0)
MCH: 28.5 pg (ref 26.0–34.0)
MCHC: 32.5 g/dL (ref 30.0–36.0)
MCV: 87.6 fL (ref 80.0–100.0)
Platelets: 189 10*3/uL (ref 150–400)
RBC: 4.84 MIL/uL (ref 3.87–5.11)
RDW: 13.1 % (ref 11.5–15.5)
WBC: 6 10*3/uL (ref 4.0–10.5)
nRBC: 0 % (ref 0.0–0.2)

## 2023-01-25 LAB — LIPASE, BLOOD: Lipase: 36 U/L (ref 11–51)

## 2023-01-25 MED ORDER — METOCLOPRAMIDE HCL 10 MG PO TABS
10.0000 mg | ORAL_TABLET | Freq: Once | ORAL | Status: AC
  Administered 2023-01-25: 10 mg via ORAL
  Filled 2023-01-25: qty 1

## 2023-01-25 MED ORDER — ONDANSETRON 4 MG PO TBDP: 4.0000 mg | ORAL_TABLET | Freq: Three times a day (TID) | ORAL | 0 refills | Status: AC | PRN

## 2023-01-25 MED ORDER — ONDANSETRON 4 MG PO TBDP
4.0000 mg | ORAL_TABLET | Freq: Once | ORAL | Status: AC | PRN
  Administered 2023-01-25: 4 mg via ORAL
  Filled 2023-01-25: qty 1

## 2023-01-25 MED ORDER — POTASSIUM CHLORIDE CRYS ER 20 MEQ PO TBCR
60.0000 meq | EXTENDED_RELEASE_TABLET | Freq: Once | ORAL | Status: AC
  Administered 2023-01-25: 60 meq via ORAL
  Filled 2023-01-25: qty 3

## 2023-01-25 MED ORDER — OXYCODONE-ACETAMINOPHEN 5-325 MG PO TABS: 1.0000 | ORAL_TABLET | Freq: Four times a day (QID) | ORAL | 0 refills | Status: AC | PRN

## 2023-01-25 MED ORDER — KETOROLAC TROMETHAMINE 15 MG/ML IJ SOLN
15.0000 mg | Freq: Once | INTRAMUSCULAR | Status: AC
  Administered 2023-01-25: 15 mg via INTRAMUSCULAR
  Filled 2023-01-25: qty 1

## 2023-01-25 NOTE — ED Notes (Addendum)
Error in charting.

## 2023-01-25 NOTE — ED Triage Notes (Signed)
Pt with c/o right lower abdominal pain since this am. Pt has hx of large kidney stones. Pt states this feels similar. Pt with nausea/ vomiting, pt denies urinary symptoms.

## 2023-01-25 NOTE — ED Provider Notes (Signed)
[facility] Provider Note    Event Date/Time   First MD Initiated Contact with Patient 01/25/23 1133     (approximate)   History   Chief Complaint: Weakness and Abdominal Pain   HPI  Angel Harrison is a 72 y.o. female with a history of hypertension, kidney stones who comes ED complaining of right mid abdominal pain radiating to the right flank that started this morning after waking up.  Associated with dry heaves but no vomiting.  No fevers or chills, no dysuria or other urinary symptoms.  No chest pain or shortness of breath.     Physical Exam   Triage Vital Signs: ED Triage Vitals  Enc Vitals Group     BP 01/25/23 0926 (!) 202/90     Pulse Rate 01/25/23 0926 65     Resp 01/25/23 0926 20     Temp 01/25/23 0928 98 F (36.7 C)     Temp Source 01/25/23 0928 Oral     SpO2 01/25/23 0926 98 %     Weight 01/25/23 0926 176 lb (79.8 kg)     Height 01/25/23 0926 4\' 11"  (1.499 m)     Head Circumference --      Peak Flow --      Pain Score 01/25/23 0926 10     Pain Loc --      Pain Edu? --      Excl. in [place]? --     Most recent vital signs: Vitals:   01/25/23 0928 01/25/23 1230  BP:  118/69  Pulse:  (!) 51  Resp:  18  Temp: 98 F (36.7 C)   SpO2:  95%    General: Awake, no distress.  CV:  Good peripheral perfusion.  Resp:  Normal effort.  Abd:  No distention.  Soft nontender Other:  Moist oral mucosa   ED Results / Procedures / Treatments   Labs (all labs ordered are listed, but only abnormal results are displayed) Labs Reviewed  COMPREHENSIVE METABOLIC PANEL - Abnormal; Notable for the following components:      Result Value   Potassium 2.9 (*)    Glucose, Bld 134 (*)    All other components within normal limits  URINALYSIS, ROUTINE W REFLEX MICROSCOPIC - Abnormal; Notable for the following components:   Color, Urine AMBER (*)    APPearance CLOUDY (*)    Hgb urine dipstick LARGE (*)    Protein, ur 100 (*)    Leukocytes,Ua  MODERATE (*)    All other components within normal limits  LIPASE, BLOOD  CBC     EKG    RADIOLOGY CT abdomen pelvis interpreted by me, shows 2 mm stone in the right mid ureter.  Radiology report reviewed noting some hydronephrosis.   PROCEDURES:  Procedures   MEDICATIONS ORDERED IN ED: Medications  ondansetron (ZOFRAN-ODT) disintegrating tablet 4 mg (4 mg Oral Given 01/25/23 0933)  ketorolac (TORADOL) 15 MG/ML injection 15 mg (15 mg Intramuscular Given 01/25/23 1232)  metoCLOPramide (REGLAN) tablet 10 mg (10 mg Oral Given 01/25/23 1231)  potassium chloride SA (KLOR-CON M) CR tablet 60 mEq (60 mEq Oral Given 01/25/23 1248)     IMPRESSION / MDM / ASSESSMENT AND PLAN / ED COURSE  I reviewed the triage vital signs and the nursing notes.  DDx: Ureterolithiasis, UTI, AKI, electrolyte abnormality, appendicitis  Patient's presentation is most consistent with acute presentation with potential threat to life or bodily function.  Patient presents with right mid abdominal pain, most  likely renal colic.  She has required lithotripsy in the past.  Labs are reassuring, creatinine normal, no sign of UTI.  Potassium was 2.9 which was supplemented today.  CT shows 2 mm stone in the mid ureter causing hydroureter axis.  This can be expected to pass on its own.  After Toradol and Reglan patient is feeling great and ready for discharge.  She will follow-up with urology as needed if symptoms do not resolve within the next few days.       FINAL CLINICAL IMPRESSION(S) / ED DIAGNOSES   Final diagnoses:  Ureterolithiasis     Rx / DC Orders   ED Discharge Orders          Ordered    oxyCODONE-acetaminophen (PERCOCET) 5-325 MG tablet  Every 6 hours PRN        01/25/23 1256    ondansetron (ZOFRAN-ODT) 4 MG disintegrating tablet  Every 8 hours PRN        01/25/23 1256             Note:  This document was prepared using Dragon voice recognition software and may include unintentional  dictation errors.   Carrie Mew, MD 01/25/23 1258

## 2023-01-25 NOTE — ED Notes (Signed)
First nurse note: Pt here from [facility] clinic to RLQ, pt states this started this morning.

## 2023-01-25 NOTE — ED Triage Notes (Deleted)
Pt in from home/work; states saw PCP yesterday for stroke-like symptoms and was told to come into ER for MRI then but couldn't until today. States PCP concerned since hx of TIA and facial droop/weakness at that time and lots of recent falls. Pt steady while ambulating currently, speech clear currently, no facial droop noted currently.   

## 2023-01-29 ENCOUNTER — Telehealth: Payer: Self-pay | Admitting: Urology

## 2023-01-29 NOTE — Telephone Encounter (Signed)
Patient was seen in ER on 2/8 for kidney stone. She has felt fine and had no symptoms since Saturday. She isn't sure if she passed the stone or not. She is asking if she should be seen, or have an xray to be sure. She doesn't want to risk having another attack of pain. Please advise.

## 2023-01-29 NOTE — Telephone Encounter (Signed)
You can scheduled her an appt with pa

## 2023-02-02 ENCOUNTER — Ambulatory Visit: Payer: Medicare PPO | Admitting: Physician Assistant

## 2023-02-02 ENCOUNTER — Ambulatory Visit
Admission: RE | Admit: 2023-02-02 | Discharge: 2023-02-02 | Disposition: A | Discharge status: Home / Self Care | Payer: Medicare PPO | Source: Ambulatory Visit | Attending: Physician Assistant | Admitting: Physician Assistant

## 2023-02-02 VITALS — BP 171/92 | HR 48 | Ht 59.0 in | Wt 176.0 lb

## 2023-02-02 DIAGNOSIS — N201 Calculus of ureter: Secondary | ICD-10-CM

## 2023-02-02 LAB — URINALYSIS, COMPLETE
Bilirubin, UA: NEGATIVE
Glucose, UA: NEGATIVE
Ketones, UA: NEGATIVE
Leukocytes,UA: NEGATIVE
Nitrite, UA: NEGATIVE
Protein,UA: NEGATIVE
Specific Gravity, UA: 1.025 (ref 1.005–1.030)
Urobilinogen, Ur: 0.2 mg/dL (ref 0.2–1.0)
pH, UA: 5.5 (ref 5.0–7.5)

## 2023-02-02 LAB — MICROSCOPIC EXAMINATION: Epithelial Cells (non renal): >10 /hpf — AB (ref 0–10)

## 2023-02-02 NOTE — Progress Notes (Unsigned)
02/02/2023 11:36 AM   Angel Harrison January 20, 1951 [id number]  CC: Chief Complaint  Patient presents with   ureteral stone    Follow up   HPI: Angel Harrison is a 72 y.o. female with PMH nephrolithiasis and nocturia on oxybutynin 5 mg nightly who presents today for ED follow-up of a stone episode.   She was seen in the [facility] ED 8 days ago with reports of right flank pain.  CT stone study revealed a 2 mm proximal right ureteral stone with upstream hydronephrosis.  UA appeared contaminated and was notable only for >50 RBC/hpf.  Today she reports she had nightly nocturia 5 days ago and when she woke in the morning, all of her symptoms completely resolved.  They have not returned.  She never saw a stone pass.  KUB today with no visible proximal right ureteral stone, however bowel contents obscure interpretation.  In-office UA today positive for trace lysed blood; urine microscopy with 3-10 WBCs/HPF, >10 epithelial cells/hpf, and many bacteria.  PMH: Past Medical History:  Diagnosis Date   Arthritis    Back pain    HOH (hard of hearing)    Hyperlipidemia    Hypertension    Kidney stone    PONV (postoperative nausea and vomiting)    Seasonal allergies    Wears glasses     Surgical History: Past Surgical History:  Procedure Laterality Date   ABDOMINAL HYSTERECTOMY  1999   BREAST REDUCTION SURGERY Bilateral 05/19/2013   Procedure: BILATERAL BREAST REDUCTION  ;  Surgeon: Cristine Polio, MD;  Location: [facility];  Service: Plastics;  Laterality: Bilateral;   CESAREAN SECTION     CHOLECYSTECTOMY  1998   lap choli   CYSTOSCOPY/RETROGRADE/URETEROSCOPY/STONE EXTRACTION WITH BASKET     DILATION AND CURETTAGE OF UTERUS     EXTRACORPOREAL SHOCK WAVE LITHOTRIPSY Left 11/15/2017   Procedure: EXTRACORPOREAL SHOCK WAVE LITHOTRIPSY (ESWL);  Surgeon: Hollice Espy, MD;  Location: [facility];  Service: Urology;  Laterality: Left;   HAMMER TOE SURGERY  1997   right  foot   LITHOTRIPSY     ORIF ANKLE FRACTURE Right 11/05/2018   Procedure: OPEN REDUCTION INTERNAL FIXATION (ORIF) ANKLE FRACTURE;  Surgeon: Corky Mull, MD;  Location: [facility];  Service: Orthopedics;  Laterality: Right;   REDUCTION MAMMAPLASTY Bilateral 05/2012    Home Medications:  Allergies as of 02/02/2023       Reactions   Darvon [propoxyphene Hcl]    Dizzy-crazy        Medication List        Accurate as of February 02, 2023 11:36 AM. If you have any questions, ask your nurse or doctor.          aspirin 325 MG tablet Take by mouth.   bisoprolol-hydrochlorothiazide 10-6.25 MG tablet Commonly known as: ZIAC Take 1 tablet by mouth daily.   lisinopril 40 MG tablet Commonly known as: ZESTRIL Take 40 mg by mouth daily.   loratadine 10 MG tablet Commonly known as: CLARITIN Take 10 mg by mouth as needed for allergies.   ondansetron 4 MG disintegrating tablet Commonly known as: ZOFRAN-ODT Take 1 tablet (4 mg total) by mouth every 8 (eight) hours as needed for nausea or vomiting.   oxybutynin 5 MG tablet Commonly known as: DITROPAN TAKE 1 TABLET BY MOUTH EVERYDAY AT BEDTIME   simvastatin 40 MG tablet Commonly known as: ZOCOR Take 40 mg by mouth every evening.        Allergies:  Allergies  Allergen Reactions   Darvon [Propoxyphene Hcl]     Dizzy-crazy    Family History: Family History  Problem Relation Age of Onset   CAD Father    Breast cancer Maternal Grandmother     Social History:   reports that she has never smoked. She has never used smokeless tobacco. She reports that she does not drink alcohol and does not use drugs.  Physical Exam: BP (!) 171/92   Pulse (!) 48   Ht 4' 11"$  (1.499 m)   Wt 176 lb (79.8 kg)   BMI 35.55 kg/m   Constitutional:  Alert and oriented, no acute distress, nontoxic appearing HEENT: [place], AT Cardiovascular: No clubbing, cyanosis, or edema Respiratory: Normal respiratory effort, no increased work of  breathing Skin: No rashes, bruises or suspicious lesions Neurologic: Grossly intact, no focal deficits, moving all 4 extremities Psychiatric: Normal mood and affect  Laboratory Data: Results for orders placed or performed in visit on 02/02/23  Microscopic Examination   Urine  Result Value Ref Range   WBC, UA 0-5 0 - 5 /hpf   RBC, Urine 3-10 (A) 0 - 2 /hpf   Epithelial Cells (non renal) >10 (A) 0 - 10 /hpf   Bacteria, UA Many (A) None seen/Few  Urinalysis, Complete  Result Value Ref Range   Specific Gravity, UA 1.025 1.005 - 1.030   pH, UA 5.5 5.0 - 7.5   Color, UA Yellow Yellow   Appearance Ur Hazy (A) Clear   Leukocytes,UA Negative Negative   Protein,UA Negative Negative/Trace   Glucose, UA Negative Negative   Ketones, UA Negative Negative   RBC, UA Trace (A) Negative   Bilirubin, UA Negative Negative   Urobilinogen, Ur 0.2 0.2 - 1.0 mg/dL   Nitrite, UA Negative Negative   Microscopic Examination See below:    Pertinent Imaging: KUB, 02/02/2023: CLINICAL DATA:  Right ureteral stone.   EXAM: ABDOMEN - 1 VIEW   COMPARISON:  CT 01/25/2023.  Abdomen 10/10/2022   FINDINGS: Stool noted throughout the colon making evaluation for stone disease difficult. No bowel distention. Left nephrolithiasis again noted. Previously identified tiny right ureteral stone not definitely visualized on today's exam. The previously identified tiny right ureteral stone may be difficult to detect given its tiny size and overlying stool. Multiple pelvic calcifications are again noted. These are most likely phleboliths they appear to be in stable position. Distal ureteral stones cannot be completely excluded. Degenerative changes scoliosis lumbar spine. Degenerative changes both hips.   IMPRESSION: 1. Left nephrolithiasis again noted. Previously identified tiny right ureteral stone not definitely visualized on today's exam. The previously identified tiny right ureteral stone may be difficult  to detect given its tiny size and overlying stool. 2. Multiple pelvic calcifications are again noted. These are most likely phleboliths as they appear to be in stable position. Distal ureteral stones cannot be completely excluded.     Electronically Signed   By: Marcello Moores  Register M.D.   On: 02/06/2023 09:29  I personally reviewed the images referenced above and note no radiopaque ureteral calculi.  Assessment & Plan:   1. Right ureteral stone Symptoms resolved, UA significantly improved over prior.  Stone not visible on x-ray, though we discussed it may be too small to be visualized.  I suspect she passed the stone, however I offered her a repeat UA and renal ultrasound for confirmation and she accepted.  Will pursue renal ultrasound within the next week and repeat a UA in 1 week.  If hydronephrosis and hematuria  have resolved, this would be consistent with interval stone passage.  Regardless, stone is small enough that she would be strongly likely to pass it spontaneously. - Abdomen 1 view (KUB); Future - Urinalysis, Complete - Urinalysis, Complete; Future - US RENAL; Future   Return in about 1 week (around 02/09/2023) for Lab visit for UA, Will call with results.  Debroah Loop, PA-C  [facility] [address] [place], [place] [address] [phone]

## 2023-02-06 ENCOUNTER — Ambulatory Visit
Admission: RE | Admit: 2023-02-06 | Discharge: 2023-02-06 | Disposition: A | Discharge status: Home / Self Care | Payer: Medicare PPO | Source: Ambulatory Visit | Attending: Physician Assistant | Admitting: Physician Assistant

## 2023-02-06 DIAGNOSIS — N201 Calculus of ureter: Secondary | ICD-10-CM

## 2023-02-09 ENCOUNTER — Other Ambulatory Visit: Payer: Medicare PPO

## 2023-02-09 DIAGNOSIS — N201 Calculus of ureter: Secondary | ICD-10-CM

## 2023-02-09 LAB — URINALYSIS, COMPLETE
Bilirubin, UA: NEGATIVE
Glucose, UA: NEGATIVE
Ketones, UA: NEGATIVE
Nitrite, UA: NEGATIVE
Protein,UA: NEGATIVE
Specific Gravity, UA: 1.02 (ref 1.005–1.030)
Urobilinogen, Ur: 0.2 mg/dL (ref 0.2–1.0)
pH, UA: 5.5 (ref 5.0–7.5)

## 2023-02-09 LAB — MICROSCOPIC EXAMINATION: Epithelial Cells (non renal): >10 /hpf — AB (ref 0–10)

## 2023-10-12 ENCOUNTER — Ambulatory Visit: Payer: Medicare PPO | Admitting: Urology

## 2023-10-30 ENCOUNTER — Other Ambulatory Visit: Payer: Self-pay | Admitting: *Deleted

## 2023-10-30 ENCOUNTER — Ambulatory Visit
Admission: RE | Admit: 2023-10-30 | Discharge: 2023-10-30 | Disposition: A | Discharge status: Home / Self Care | Payer: Medicare PPO | Attending: Urology | Admitting: Urology

## 2023-10-30 ENCOUNTER — Ambulatory Visit
Admission: RE | Admit: 2023-10-30 | Discharge: 2023-10-30 | Disposition: A | Discharge status: Home / Self Care | Payer: Medicare PPO | Source: Ambulatory Visit | Attending: Urology | Admitting: Urology

## 2023-10-30 DIAGNOSIS — N201 Calculus of ureter: Secondary | ICD-10-CM

## 2023-10-31 ENCOUNTER — Ambulatory Visit: Payer: Medicare PPO | Admitting: Urology

## 2023-10-31 ENCOUNTER — Encounter: Payer: Self-pay | Admitting: Urology

## 2023-10-31 VITALS — BP 147/77 | HR 69 | Ht 60.0 in | Wt 172.0 lb

## 2023-10-31 DIAGNOSIS — N200 Calculus of kidney: Secondary | ICD-10-CM | POA: Diagnosis not present

## 2023-10-31 DIAGNOSIS — R351 Nocturia: Secondary | ICD-10-CM

## 2023-10-31 DIAGNOSIS — Z87898 Personal history of other specified conditions: Secondary | ICD-10-CM

## 2023-10-31 NOTE — Progress Notes (Signed)
I, Maysun Anabel Bene, acting as a scribe for [name], MD., have documented all relevant documentation on the behalf of [name], MD, as directed by [name], MD while in the presence of [name], MD.  10/31/2023 3:15 PM   Angel Harrison 1951/12/05 [id number]  Referring provider: Gracelyn Nurse, MD [address] [facility] [place],  [place] [address]  Chief Complaint  Patient presents with   Nephrolithiasis   Urologic history: 1.  Nephrolithiasis SWL 9 mm ureteral calculus November 2018 Renal calculi   2.  Nocturia Oxybutynin IR 5 mg nightly  HPI: Angel Harrison is a 72 y.o. female presents for annual follow-up.  She saw Estanislado Pandy February 2024 after an ED visit with a 2mm right proximal ureteral stone, which she subsequently passed. Follow-up renal ultrasound showed no hydronephrosis and KUB did not show any evidence of stone.  She remains on immediate release of oxybutynin prior to bedtime with a resolution of her nocturia.  Presently denies flank, abdominal or pelvic pain No bothersome LUTS or gross hematuria.    PMH: Past Medical History:  Diagnosis Date   Arthritis    Back pain    HOH (hard of hearing)    Hyperlipidemia    Hypertension    Kidney stone    PONV (postoperative nausea and vomiting)    Seasonal allergies    Wears glasses     Surgical History: Past Surgical History:  Procedure Laterality Date   ABDOMINAL HYSTERECTOMY  1999   BREAST REDUCTION SURGERY Bilateral 05/19/2013   Procedure: BILATERAL BREAST REDUCTION  ;  Surgeon: Louisa Second, MD;  Location: [facility];  Service: Plastics;  Laterality: Bilateral;   CESAREAN SECTION     CHOLECYSTECTOMY  1998   lap choli   CYSTOSCOPY/RETROGRADE/URETEROSCOPY/STONE EXTRACTION WITH BASKET     DILATION AND CURETTAGE OF UTERUS     EXTRACORPOREAL SHOCK WAVE LITHOTRIPSY Left 11/15/2017   Procedure: EXTRACORPOREAL SHOCK WAVE LITHOTRIPSY  (ESWL);  Surgeon: Vanna Scotland, MD;  Location: [facility];  Service: Urology;  Laterality: Left;   HAMMER TOE SURGERY  1997   right foot   LITHOTRIPSY     ORIF ANKLE FRACTURE Right 11/05/2018   Procedure: OPEN REDUCTION INTERNAL FIXATION (ORIF) ANKLE FRACTURE;  Surgeon: Christena Flake, MD;  Location: [facility];  Service: Orthopedics;  Laterality: Right;   REDUCTION MAMMAPLASTY Bilateral 05/2012    Home Medications:  Allergies as of 10/31/2023       Reactions   Darvon [propoxyphene Hcl]    Dizzy-crazy        Medication List        Accurate as of October 31, 2023  3:15 PM. If you have any questions, ask your nurse or doctor.          aspirin 325 MG tablet Take by mouth.   bisoprolol-hydrochlorothiazide 10-6.25 MG tablet Commonly known as: ZIAC Take 1 tablet by mouth daily.   lisinopril 40 MG tablet Commonly known as: ZESTRIL Take 40 mg by mouth daily.   loratadine 10 MG tablet Commonly known as: CLARITIN Take 10 mg by mouth as needed for allergies.   ondansetron 4 MG disintegrating tablet Commonly known as: ZOFRAN-ODT Take 1 tablet (4 mg total) by mouth every 8 (eight) hours as needed for nausea or vomiting.   oxybutynin 5 MG tablet Commonly known as: DITROPAN TAKE 1 TABLET BY MOUTH EVERYDAY AT BEDTIME   simvastatin 40 MG tablet Commonly known as:  ZOCOR Take 40 mg by mouth every evening.        Allergies:  Allergies  Allergen Reactions   Darvon [Propoxyphene Hcl]     Dizzy-crazy    Family History: Family History  Problem Relation Age of Onset   CAD Father    Breast cancer Maternal Grandmother     Social History:  reports that she has never smoked. She has never used smokeless tobacco. She reports that she does not drink alcohol and does not use drugs.   Physical Exam: BP (!) 147/77   Pulse 69   Ht 5' (1.524 m)   Wt 172 lb (78 kg)   BMI 33.59 kg/m   Constitutional:  Alert and oriented, No acute distress. HEENT: [place] AT, moist mucus  membranes.  Trachea midline, no masses. Cardiovascular: No clubbing, cyanosis, or edema. Respiratory: Normal respiratory effort, no increased work of breathing. GI: Abdomen is soft, nontender, nondistended, no abdominal masses Skin: No rashes, bruises or suspicious lesions. Neurologic: Grossly intact, no focal deficits, moving all 4 extremities. Psychiatric: Normal mood and affect.   Pertinent Imaging: KUB performed earlier today was personally reviewed and interpreted. There is a 4 mm calcification overlying the inferior portion of the left renal outline. The KUB has not been read by radiology.    Assessment & Plan:    1. Nephrolithiasis Non-obstructing renal calculi 1 year follow-up with KUB   2. Nocturia  Resolved with intermediate release oxybutane at bedtime which she will continue.   I have reviewed the above documentation for accuracy and completeness, and I agree with the above.   [name], MD  [facility] [address] [place], [place] [address] [phone]

## 2023-11-17 ENCOUNTER — Other Ambulatory Visit: Payer: Self-pay | Admitting: Urology

## 2023-11-17 DIAGNOSIS — R351 Nocturia: Secondary | ICD-10-CM

## 2024-07-15 ENCOUNTER — Encounter: Payer: Self-pay | Admitting: Urology

## 2024-10-28 ENCOUNTER — Ambulatory Visit
Admission: RE | Admit: 2024-10-28 | Discharge: 2024-10-28 | Disposition: A | Discharge status: Home / Self Care | Attending: Urology | Admitting: Urology

## 2024-10-28 ENCOUNTER — Ambulatory Visit
Admission: RE | Admit: 2024-10-28 | Discharge: 2024-10-28 | Disposition: A | Source: Ambulatory Visit | Attending: Urology | Admitting: Urology

## 2024-10-28 ENCOUNTER — Ambulatory Visit: Admitting: Urology

## 2024-10-28 ENCOUNTER — Encounter: Payer: Self-pay | Admitting: Urology

## 2024-10-28 VITALS — BP 141/82 | HR 57 | Ht 59.0 in | Wt 173.0 lb

## 2024-10-28 DIAGNOSIS — R351 Nocturia: Secondary | ICD-10-CM

## 2024-10-28 DIAGNOSIS — N200 Calculus of kidney: Secondary | ICD-10-CM | POA: Insufficient documentation

## 2024-10-28 MED ORDER — OXYBUTYNIN CHLORIDE 5 MG PO TABS: ORAL_TABLET | ORAL | 3 refills | Status: AC

## 2024-10-28 NOTE — Progress Notes (Signed)
 10/28/2024 1:24 PM   Angel Harrison March 25, 1951 [id number]  Referring provider: Rudolpho Norleen BIRCH, MD [address] Angel Harrison,  Angel Harrison  Chief Complaint  Patient presents with   Nephrolithiasis    Urologic history: 1.  Nephrolithiasis SWL 9 mm ureteral calculus November 2018 Renal calculi   2.  Nocturia Oxybutynin  IR 5 mg nightly   HPI: Angel Harrison is a 73 y.o. female presents for annual follow-up.  No flank, abdominal, pelvic pain since last visit Has occasional urge incontinence and she finds when she delays emptying her leakage typically happens Remains on low-dose extended release oxybutynin  and feels she does get some benefit No dysuria or gross hematuria   PMH: Past Medical History:  Diagnosis Date   Arthritis    Back pain    HOH (hard of hearing)    Hyperlipidemia    Hypertension    Kidney stone    PONV (postoperative nausea and vomiting)    Seasonal allergies    Wears glasses     Surgical History: Past Surgical History:  Procedure Laterality Date   ABDOMINAL HYSTERECTOMY  1999   BREAST REDUCTION SURGERY Bilateral 05/19/2013   Procedure: BILATERAL BREAST REDUCTION  ;  Surgeon: Elna Pick, MD;  Location: [facility];  Service: Plastics;  Laterality: Bilateral;   CESAREAN SECTION     CHOLECYSTECTOMY  1998   lap choli   CYSTOSCOPY/RETROGRADE/URETEROSCOPY/STONE EXTRACTION WITH BASKET     DILATION AND CURETTAGE OF UTERUS     EXTRACORPOREAL SHOCK WAVE LITHOTRIPSY Left 11/15/2017   Procedure: EXTRACORPOREAL SHOCK WAVE LITHOTRIPSY (ESWL);  Surgeon: Penne Knee, MD;  Location: [facility];  Service: Urology;  Laterality: Left;   HAMMER TOE SURGERY  1997   right foot   LITHOTRIPSY     ORIF ANKLE FRACTURE Right 11/05/2018   Procedure: OPEN REDUCTION INTERNAL FIXATION (ORIF) ANKLE FRACTURE;  Surgeon: Edie Norleen PARAS, MD;  Location: [facility];  Service: Orthopedics;  Laterality: Right;   REDUCTION  MAMMAPLASTY Bilateral 05/2012    Home Medications:  Allergies as of 10/28/2024       Reactions   Darvon [propoxyphene Hcl]    Dizzy-crazy        Medication List        Accurate as of October 28, 2024  1:24 PM. If you have any questions, ask your nurse or doctor.          aspirin 325 MG tablet Take by mouth.   bisoprolol -hydrochlorothiazide  10-6.25 MG tablet Commonly known as: ZIAC  Take 1 tablet by mouth daily.   lisinopril 40 MG tablet Commonly known as: ZESTRIL Take 40 mg by mouth daily.   loratadine  10 MG tablet Commonly known as: CLARITIN  Take 10 mg by mouth as needed for allergies.   ondansetron  4 MG disintegrating tablet Commonly known as: ZOFRAN -ODT Take 1 tablet (4 mg total) by mouth every 8 (eight) hours as needed for nausea or vomiting.   oxybutynin  5 MG tablet Commonly known as: DITROPAN  TAKE 1 TABLET BY MOUTH EVERYDAY AT BEDTIME   simvastatin 40 MG tablet Commonly known as: ZOCOR Take 40 mg by mouth every evening.        Allergies:  Allergies  Allergen Reactions   Darvon [Propoxyphene Hcl]     Dizzy-crazy    Family History: Family History  Problem Relation Age of Onset   CAD Father    Breast cancer Maternal Grandmother     Social History:  reports that she has never smoked. She  has never used smokeless tobacco. She reports that she does not drink alcohol and does not use drugs.   Physical Exam: BP (!) 141/82   Pulse (!) 57   Ht 4' 11 (1.499 m)   Wt 173 lb (78.5 kg)   BMI 34.94 kg/m   Constitutional:  Alert, No acute distress. HEENT: [place] AT Respiratory: Normal respiratory effort, no increased work of breathing. Psychiatric: Normal mood and affect.   Pertinent Imaging: KUB performed earlier today was personally reviewed and interpreted.  Stable left lower pole calcification.  Multiple costochondral calcifications and calcification superior to the right renal outline  Assessment & Plan:    1.   Nephrolithiasis Stable Asymptomatic 1 year follow-up with KUB  2.  Overactive bladder Stable Oxybutynin  refilled   [name], MD  [facility] [address] [place], [place] Harrison [phone]

## 2024-10-30 ENCOUNTER — Ambulatory Visit: Payer: Self-pay | Admitting: Urology

## 2024-11-29 ENCOUNTER — Emergency Department

## 2024-11-29 ENCOUNTER — Emergency Department: Admission: EM | Admit: 2024-11-29 | Discharge: 2024-11-29 | Disposition: A | Discharge status: Home / Self Care

## 2024-11-29 ENCOUNTER — Other Ambulatory Visit: Payer: Self-pay

## 2024-11-29 DIAGNOSIS — M545 Low back pain, unspecified: Secondary | ICD-10-CM | POA: Insufficient documentation

## 2024-11-29 DIAGNOSIS — R519 Headache, unspecified: Secondary | ICD-10-CM | POA: Insufficient documentation

## 2024-11-29 DIAGNOSIS — W01198A Fall on same level from slipping, tripping and stumbling with subsequent striking against other object, initial encounter: Secondary | ICD-10-CM | POA: Insufficient documentation

## 2024-11-29 DIAGNOSIS — W19XXXA Unspecified fall, initial encounter: Secondary | ICD-10-CM

## 2024-11-29 NOTE — ED Provider Notes (Signed)
 [facility] Provider Note    Event Date/Time   First MD Initiated Contact with Patient 11/29/24 2049     (approximate)   History   Fall   HPI  Angel Harrison is a 73 y.o. female presenting to the emergency department complaining of a fall down 2 concrete steps.  The patient reports that she did hit the back of her head on the ground.  She reports that her head is feeling improved but that she is now having pain in her tailbone.  She denies any loss of consciousness.  She does not take any blood thinners.     Physical Exam   Triage Vital Signs: ED Triage Vitals  Encounter Vitals Group     BP 11/29/24 1821 (!) 175/78     Girls Systolic BP Percentile --      Girls Diastolic BP Percentile --      Boys Systolic BP Percentile --      Boys Diastolic BP Percentile --      Pulse Rate 11/29/24 1821 (!) 56     Resp 11/29/24 1821 16     Temp 11/29/24 1821 98 F (36.7 C)     Temp Source 11/29/24 1821 Oral     SpO2 11/29/24 1821 96 %     Weight 11/29/24 1822 173 lb (78.5 kg)     Height 11/29/24 1822 4' 11 (1.499 m)     Head Circumference --      Peak Flow --      Pain Score 11/29/24 1822 0     Pain Loc --      Pain Education --      Exclude from Growth Chart --     Most recent vital signs: Vitals:   11/29/24 1821  BP: (!) 175/78  Pulse: (!) 56  Resp: 16  Temp: 98 F (36.7 C)  SpO2: 96%     General: Awake, no distress.  CV:  Good peripheral perfusion.  Resp:  Normal effort.  Abd:  No distention.  Other:  No midline spinal tenderness.   ED Results / Procedures / Treatments   Labs (all labs ordered are listed, but only abnormal results are displayed) Labs Reviewed - No data to display     RADIOLOGY CT of the head and coccyx/sacral x-rays did not show any acute traumatic findings.    PROCEDURES:  Critical Care performed: No  Procedures   MEDICATIONS ORDERED IN ED: Medications - No data to display   IMPRESSION / MDM /  ASSESSMENT AND PLAN / ED COURSE  I reviewed the triage vital signs and the nursing notes.                              Differential diagnosis includes, but is not limited to, vertebral fracture, musculoskeletal injury, ICH  Patient's presentation is most consistent with acute, uncomplicated illness.  Patient is a 73 year old female presenting to the emergency department after a fall complaining of head pain and pain to her tailbone.  Imaging was unremarkable.  Discussed plan with patient for discharge with instructions to follow-up with primary care physician within the next few days.  She is to take Tylenol /Motrin as needed for pain control.  Return to the emergency department for new or worsening symptoms.      FINAL CLINICAL IMPRESSION(S) / ED DIAGNOSES   Final diagnoses:  None     Rx / DC Orders  ED Discharge Orders     None        Note:  This document was prepared using Dragon voice recognition software and may include unintentional dictation errors.

## 2024-11-29 NOTE — ED Triage Notes (Signed)
 First nurse note: pt to ED [facility] for fall on 2 concrete stairs, no LOC, no blood thinners. Denies pain or injuries.

## 2024-11-29 NOTE — ED Triage Notes (Signed)
 Pt present to ER via EMS. Reports she was going up the stairs and was going to step on the 2nd step and fell backwards hitting the back of her head. Pt denies passing out.

## 2025-10-28 ENCOUNTER — Ambulatory Visit: Admitting: Urology
